# Patient Record
Sex: Female | Born: 1975
Health system: Southern US, Community
[De-identification: ages and names within clinical notes are randomized; demographics above are authoritative.]

## PROBLEM LIST (undated history)

## (undated) DIAGNOSIS — I7300 Raynaud's syndrome without gangrene: Secondary | ICD-10-CM

## (undated) DIAGNOSIS — E559 Vitamin D deficiency, unspecified: Secondary | ICD-10-CM

## (undated) HISTORY — DX: Raynaud's syndrome without gangrene: I73.00

## (undated) HISTORY — PX: KNEE SURGERY: SHX244

## (undated) HISTORY — DX: Vitamin D deficiency, unspecified: E55.9

---

## 1998-03-11 ENCOUNTER — Ambulatory Visit (HOSPITAL_COMMUNITY): Admission: RE | Admit: 1998-03-11 | Discharge: 1998-03-11 | Payer: Self-pay | Admitting: Internal Medicine

## 1998-11-27 ENCOUNTER — Encounter: Admission: RE | Admit: 1998-11-27 | Discharge: 1998-12-30 | Payer: Self-pay

## 1999-01-02 ENCOUNTER — Other Ambulatory Visit: Admission: RE | Admit: 1999-01-02 | Discharge: 1999-01-02 | Payer: Self-pay | Admitting: Gynecology

## 1999-11-06 ENCOUNTER — Emergency Department (HOSPITAL_COMMUNITY): Admission: EM | Admit: 1999-11-06 | Discharge: 1999-11-06 | Payer: Self-pay | Admitting: Emergency Medicine

## 2000-01-11 ENCOUNTER — Encounter: Admission: RE | Admit: 2000-01-11 | Discharge: 2000-01-11 | Payer: Self-pay | Admitting: Gynecology

## 2000-01-11 ENCOUNTER — Encounter: Payer: Self-pay | Admitting: Gynecology

## 2000-04-11 ENCOUNTER — Other Ambulatory Visit: Admission: RE | Admit: 2000-04-11 | Discharge: 2000-04-11 | Payer: Self-pay | Admitting: Gynecology

## 2000-11-16 ENCOUNTER — Encounter: Payer: Self-pay | Admitting: Obstetrics and Gynecology

## 2000-11-16 ENCOUNTER — Encounter: Admission: RE | Admit: 2000-11-16 | Discharge: 2000-11-16 | Payer: Self-pay | Admitting: Obstetrics and Gynecology

## 2000-12-12 ENCOUNTER — Inpatient Hospital Stay (HOSPITAL_COMMUNITY): Admission: AD | Admit: 2000-12-12 | Discharge: 2000-12-12 | Payer: Self-pay | Admitting: *Deleted

## 2000-12-15 ENCOUNTER — Inpatient Hospital Stay (HOSPITAL_COMMUNITY): Admission: AD | Admit: 2000-12-15 | Discharge: 2000-12-15 | Payer: Self-pay | Admitting: Obstetrics & Gynecology

## 2000-12-21 ENCOUNTER — Inpatient Hospital Stay (HOSPITAL_COMMUNITY): Admission: AD | Admit: 2000-12-21 | Discharge: 2000-12-21 | Payer: Self-pay | Admitting: Obstetrics and Gynecology

## 2001-03-05 ENCOUNTER — Inpatient Hospital Stay (HOSPITAL_COMMUNITY): Admission: AD | Admit: 2001-03-05 | Discharge: 2001-03-07 | Payer: Self-pay | Admitting: Obstetrics and Gynecology

## 2001-04-10 ENCOUNTER — Other Ambulatory Visit: Admission: RE | Admit: 2001-04-10 | Discharge: 2001-04-10 | Payer: Self-pay | Admitting: Obstetrics and Gynecology

## 2001-12-03 ENCOUNTER — Other Ambulatory Visit: Admission: RE | Admit: 2001-12-03 | Discharge: 2001-12-03 | Payer: Self-pay | Admitting: Obstetrics and Gynecology

## 2002-05-28 ENCOUNTER — Inpatient Hospital Stay (HOSPITAL_COMMUNITY): Admission: AD | Admit: 2002-05-28 | Discharge: 2002-05-31 | Payer: Self-pay | Admitting: Obstetrics and Gynecology

## 2002-07-04 ENCOUNTER — Other Ambulatory Visit: Admission: RE | Admit: 2002-07-04 | Discharge: 2002-07-04 | Payer: Self-pay | Admitting: Obstetrics and Gynecology

## 2003-01-10 ENCOUNTER — Encounter: Admission: RE | Admit: 2003-01-10 | Discharge: 2003-01-10 | Payer: Self-pay | Admitting: Gynecology

## 2003-01-10 ENCOUNTER — Encounter: Payer: Self-pay | Admitting: Gynecology

## 2003-02-11 ENCOUNTER — Encounter: Admission: RE | Admit: 2003-02-11 | Discharge: 2003-02-11 | Payer: Self-pay | Admitting: Gynecology

## 2003-02-11 ENCOUNTER — Encounter: Payer: Self-pay | Admitting: Gynecology

## 2003-07-30 ENCOUNTER — Other Ambulatory Visit: Admission: RE | Admit: 2003-07-30 | Discharge: 2003-07-30 | Payer: Self-pay | Admitting: Gynecology

## 2003-12-09 ENCOUNTER — Inpatient Hospital Stay (HOSPITAL_COMMUNITY): Admission: AD | Admit: 2003-12-09 | Discharge: 2003-12-09 | Payer: Self-pay | Admitting: Obstetrics & Gynecology

## 2004-01-28 ENCOUNTER — Inpatient Hospital Stay (HOSPITAL_COMMUNITY): Admission: AD | Admit: 2004-01-28 | Discharge: 2004-01-31 | Payer: Self-pay | Admitting: Obstetrics and Gynecology

## 2004-01-28 ENCOUNTER — Inpatient Hospital Stay (HOSPITAL_COMMUNITY): Admission: AD | Admit: 2004-01-28 | Discharge: 2004-01-28 | Payer: Self-pay | Admitting: Obstetrics and Gynecology

## 2004-03-12 ENCOUNTER — Other Ambulatory Visit: Admission: RE | Admit: 2004-03-12 | Discharge: 2004-03-12 | Payer: Self-pay | Admitting: Obstetrics and Gynecology

## 2004-08-19 ENCOUNTER — Encounter (INDEPENDENT_AMBULATORY_CARE_PROVIDER_SITE_OTHER): Payer: Self-pay | Admitting: *Deleted

## 2004-08-19 ENCOUNTER — Ambulatory Visit (HOSPITAL_COMMUNITY): Admission: RE | Admit: 2004-08-19 | Discharge: 2004-08-19 | Payer: Self-pay | Admitting: Physical Therapy

## 2005-04-13 ENCOUNTER — Other Ambulatory Visit: Admission: RE | Admit: 2005-04-13 | Discharge: 2005-04-13 | Payer: Self-pay | Admitting: Gynecology

## 2006-04-26 ENCOUNTER — Other Ambulatory Visit: Admission: RE | Admit: 2006-04-26 | Discharge: 2006-04-26 | Payer: Self-pay | Admitting: Gynecology

## 2009-01-09 ENCOUNTER — Ambulatory Visit (HOSPITAL_COMMUNITY): Admission: RE | Admit: 2009-01-09 | Discharge: 2009-01-09 | Payer: Self-pay | Admitting: Family Medicine

## 2009-11-10 ENCOUNTER — Ambulatory Visit (HOSPITAL_COMMUNITY): Admission: RE | Admit: 2009-11-10 | Discharge: 2009-11-10 | Payer: Self-pay | Admitting: Internal Medicine

## 2010-05-29 IMAGING — CT CT ABD-PELV W/O CM
3 of 4 series · 8 of 46 positions shown, 15 images · non-contrast
Comparison: None.

CLINICAL DATA: Right flank pain.  Microhematuria.

CT ABDOMEN AND PELVIS WITHOUT CONTRAST
TECHNIQUE: Multidetector CT imaging of the abdomen and pelvis was
performed following the standard protocol without intravenous
contrast.

[Series 3: lung 5.0 b60f · axial · 0.66mm/px · z∈[-146,-86]mm · 4 of 21 slices shown, 9 images]
[im 5/21  soft-tissue]
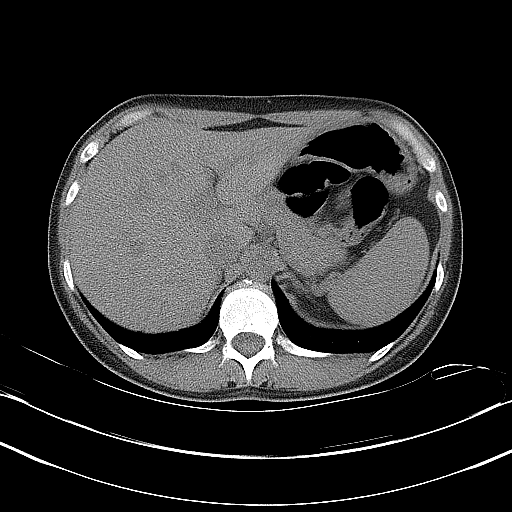
[im 5/21  lung]
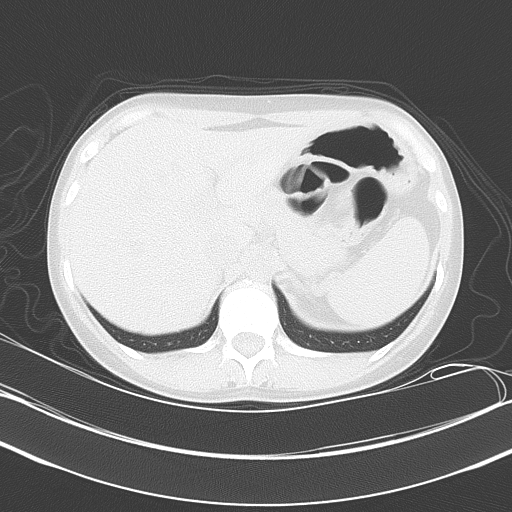
[im 5/21  bone]
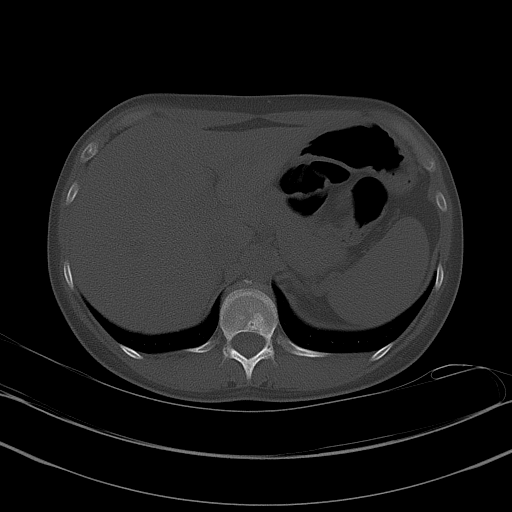
[im 9/21  soft-tissue]
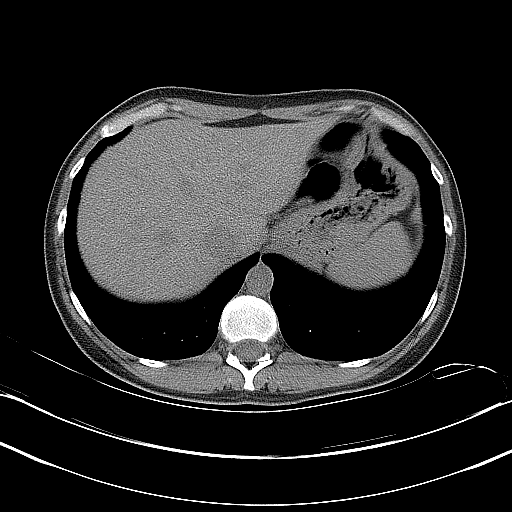
[im 9/21  lung]
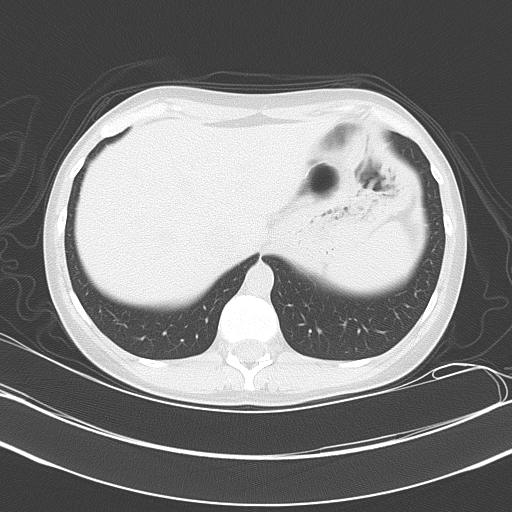
[im 13/21  soft-tissue]
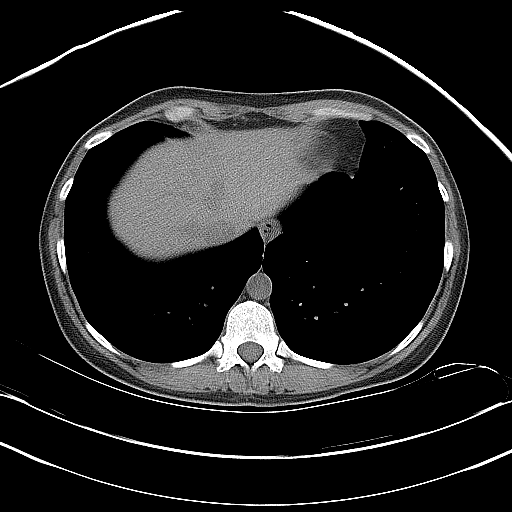
[im 13/21  lung]
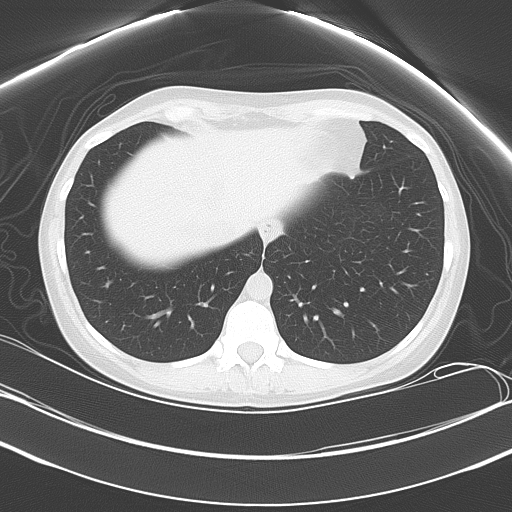
[im 17/21  soft-tissue]
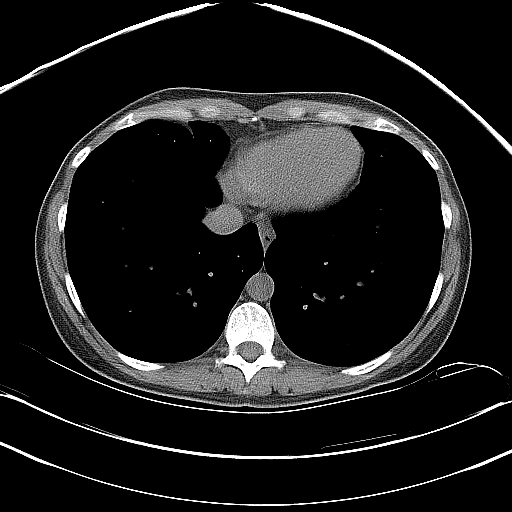
[im 17/21  lung]
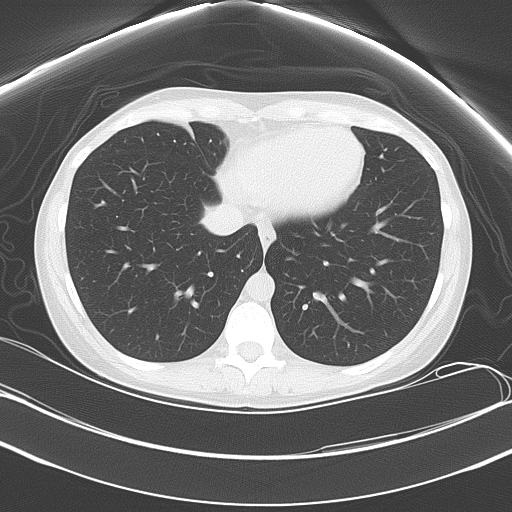

[Series 4: mpr coronal (id) · coronal · 0.64mm/px · 3 of 66 slices shown, 4 images]
[im 22/66  soft-tissue]
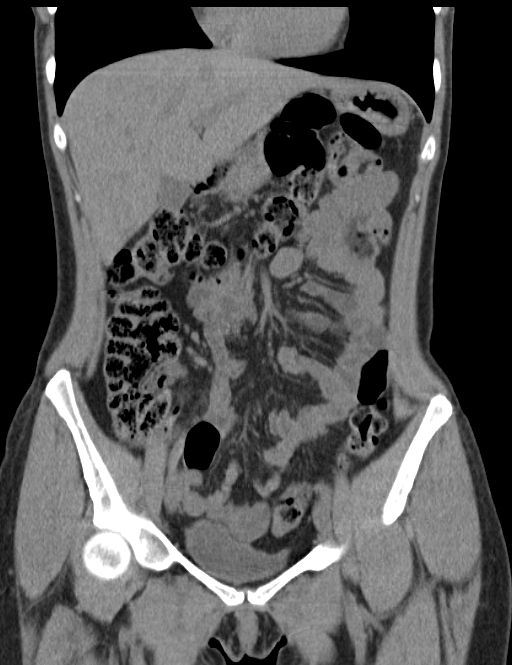
[im 29/66  soft-tissue]
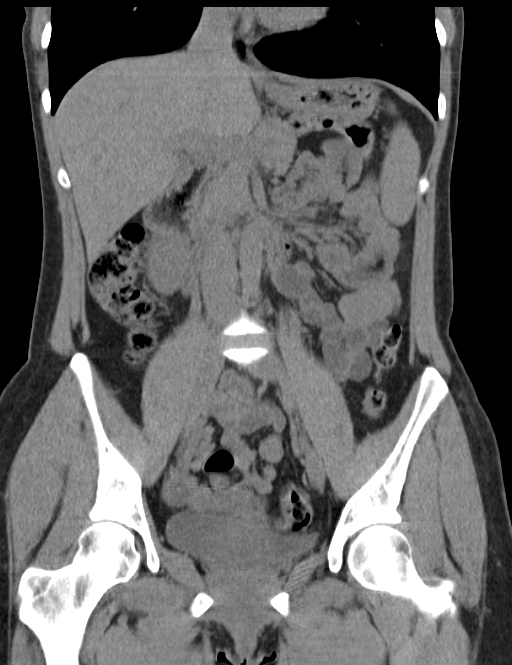
[im 29/66  bone]
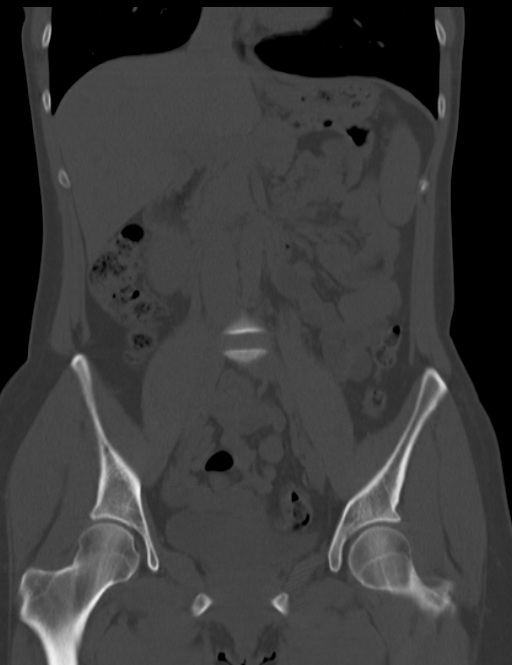
[im 37/66  soft-tissue]
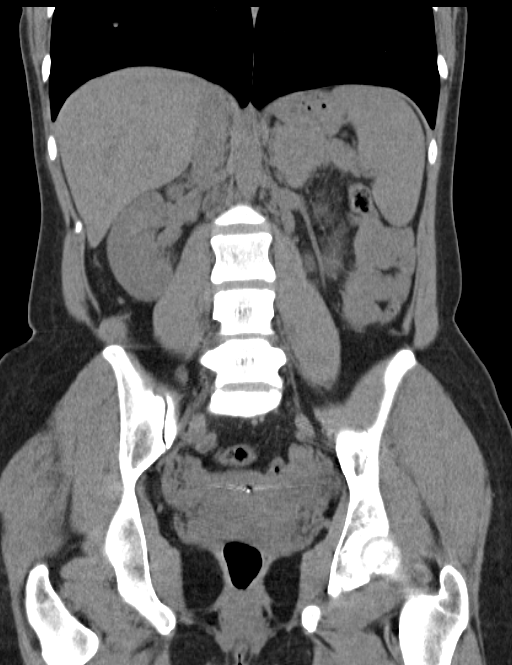

[Series 5: mpr sagittal (id) · sagittal · 0.42mm/px · 1 of 111 slices shown, 2 images]
[im 37/111  soft-tissue]
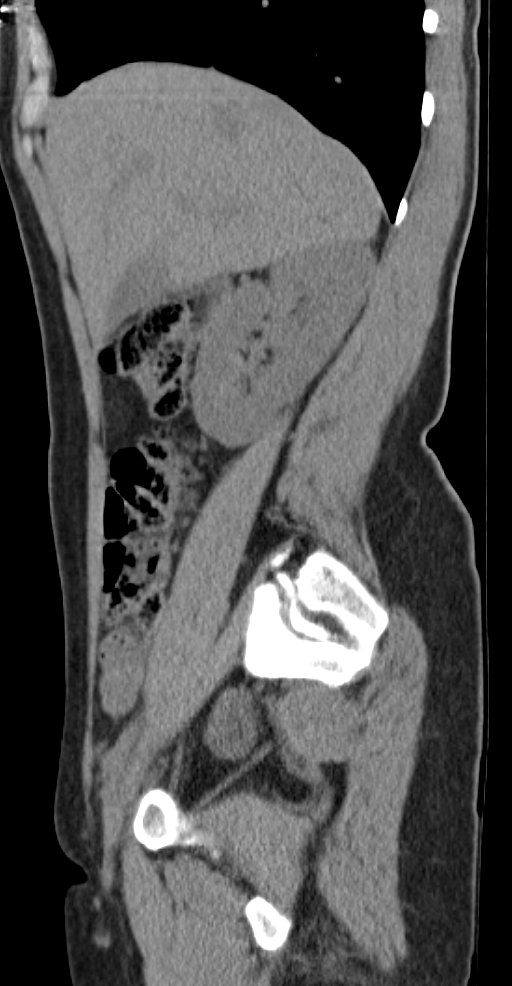
[im 37/111  bone]
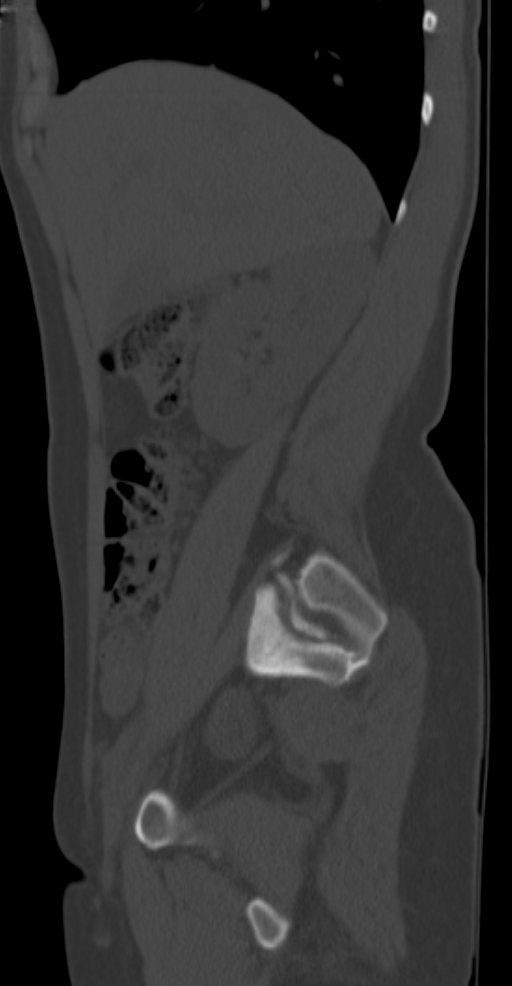

[8 of 46 positions shown; findings below may reference images not displayed]

FINDINGS: The lung bases are clear without focal nodule, mass, or
airspace disease.  Heart size is normal.  No significant pleural or
pericardial effusion is present.  The liver and spleen are within
normal limits.  The stomach, duodenum, and pancreas are normal.
The common bile duct and gallbladder are within normal limits.  The
adrenal glands are unremarkable.  The right kidney is within normal
limits.  The ureter is visualized and normal throughout its course
to the urinary bladder.  A single nonobstructive stone of the left
kidney measures 2.4 mm.

Diverticular changes are noted within the sigmoid colon.  No focal
inflammation is present to suggest diverticulitis.  The remainder
of the colon is within normal limits.  The appendix is visualized
and normal.  The small bowel is unremarkable.  An IUD is in place.
The uterus is otherwise normal.  The adnexa are within normal
limits for age.  No significant free fluid is present.  The urinary
bladder is mostly collapsed.

The bone windows demonstrate degenerative changes of the SI joints
bilaterally.  Inferior endplate Schmorl's nodes are present at T12
and L1.
IMPRESSION: 1.  No significant right-sided nephrolithiasis or hydronephrosis.
2.  2.5 mm nonobstructive stone in the left kidney.
3.  IUD in place without complication.
4.  Normal appendix.

## 2010-12-31 NOTE — Op Note (Signed)
[name], Megan Snow         ACCOUNT NO.:  [id number]   MEDICAL RECORD NO.:  [id number]          PATIENT TYPE:  AMB   LOCATION:  [facility]                           FACILITY:  [facility]   PHYSICIAN:  Dineen Kid. Rana Snare, M.D.    DATE OF BIRTH:  06-Jun-1976   DATE OF PROCEDURE:  08/19/2004  DATE OF DISCHARGE:                                 OPERATIVE REPORT   PREOPERATIVE DIAGNOSIS:  Intrauterine pregnancy at 7 weeks, desired  termination.   POSTOPERATIVE DIAGNOSIS:  Intrauterine pregnancy at 7 weeks, desired  termination.   PROCEDURE:  Dilatation and evacuation.   SURGEON:  Dineen Kid. Rana Snare, M.D.   ANESTHESIA:  Monitored anesthesia care and paracervical block.   INDICATIONS FOR PROCEDURE:  The patient is a 35 year old gravida 4, para 3,  who finds herself unexpectedly pregnant with three children now under the  age of 31.  For social and emotional reasons, she desires termination of  pregnancy and presents for that today.  Risks and benefits were discussed.  Informed consent was obtained.  Her blood type is A positive.   DESCRIPTION OF PROCEDURE:  After adequate analgesia, the patient was placed  in the dorsal lithotomy position.  She was sterilely prepped and draped.  The bladder was sterilely drained.  Graves speculum was placed.  Paracervical block was placed with 1% Xylocaine with 1:100,000 epinephrine,  20 mL total used.  The uterus was sounded to 8 cm and easily dilated to a  #27 Pratt dilator.  A 7 mm suction curet was inserted and products of  conception were retrieved.  This was performed until a gritty surface was  felt throughout the endometrial cavity and no more residual products of  conception.  The patient was given Methergine 0.2 mg IM with good response.  The curet was removed.  The speculum removed from the cervix.  Hemostasis  was achieved.  The speculum was then removed and the patient transferred to  the recovery room in stable condition.  Needle, sponge, and instrument  counts correct x3. Estimated blood loss was 25 mL.  The patient received 30  mg of Toradol IV and 1 gram of Rocephin IV in the recovery room.   DISPOSITION:  The patient will be discharged home and will follow up in the  office in two to three weeks.  She was sent home with a prescription for  Methergine 0.2 mg to take every eight hours for two days and told to take  ibuprofen as needed.  She was told to return for increased pain, fever, or  bleeding.     Davi   DCL/MEDQ  D:  08/19/2004  T:  08/19/2004  Job:  [id number]

## 2011-03-21 ENCOUNTER — Ambulatory Visit (HOSPITAL_COMMUNITY)
Admission: RE | Admit: 2011-03-21 | Discharge: 2011-03-21 | Disposition: A | Discharge status: Home / Self Care | Payer: BC Managed Care – PPO | Source: Ambulatory Visit | Attending: Family Medicine | Admitting: Family Medicine

## 2011-03-21 ENCOUNTER — Other Ambulatory Visit (HOSPITAL_COMMUNITY): Payer: Self-pay | Admitting: Family Medicine

## 2011-03-21 DIAGNOSIS — R059 Cough, unspecified: Secondary | ICD-10-CM

## 2011-03-21 DIAGNOSIS — R05 Cough: Secondary | ICD-10-CM

## 2012-02-29 ENCOUNTER — Ambulatory Visit (INDEPENDENT_AMBULATORY_CARE_PROVIDER_SITE_OTHER): Payer: BC Managed Care – PPO

## 2012-02-29 ENCOUNTER — Encounter: Payer: Self-pay | Admitting: Orthopedic Surgery

## 2012-02-29 ENCOUNTER — Ambulatory Visit (INDEPENDENT_AMBULATORY_CARE_PROVIDER_SITE_OTHER): Payer: BC Managed Care – PPO | Admitting: Orthopedic Surgery

## 2012-02-29 VITALS — BP 130/80 | Ht 65.0 in | Wt 136.0 lb

## 2012-02-29 DIAGNOSIS — M7711 Lateral epicondylitis, right elbow: Secondary | ICD-10-CM

## 2012-02-29 DIAGNOSIS — M7710 Lateral epicondylitis, unspecified elbow: Secondary | ICD-10-CM

## 2012-02-29 DIAGNOSIS — M25521 Pain in right elbow: Secondary | ICD-10-CM

## 2012-02-29 DIAGNOSIS — M25529 Pain in unspecified elbow: Secondary | ICD-10-CM

## 2012-02-29 NOTE — Patient Instructions (Addendum)
You have received a steroid shot. 15% of patients experience increased pain at the injection site with in the next 24 hours. This is best treated with ice and tylenol extra strength 2 tabs every 8 hours. If you are still having pain please call the office.   Apply over the counter max freeze as needed   Knee exercises   Elbow exercises   Lateral Epicondylitis (Tennis Elbow) with Rehab Lateral epicondylitis involves inflammation and pain around the outer portion of the elbow. The pain is caused by inflammation of the tendons in the forearm that bring back (extend) the wrist. Lateral epicondylittis is also called tennis elbow, because it is very common in tennis players. However, it may occur in any individual who extends the wrist repetitively. If lateral epicondylitis is left untreated, it may become a chronic problem. SYMPTOMS    Pain, tenderness, and inflammation on the outer (lateral) side of the elbow.   Pain or weakness with gripping activities.   Pain that increases with wrist twisting motions (playing tennis, using a screwdriver, opening a door or a jar).   Pain with lifting objects, including a coffee cup.  CAUSES   Lateral epicondylitis is caused by inflammation of the tendons that extend the wrist. Causes of injury may include:  Repetitive stress and strain on the muscles and tendons that extend the wrist.   Sudden change in activity level or intensity.   Incorrect grip in racquet sports.   Incorrect grip size of racquet (often too large).   Incorrect hitting position or technique (usually backhand, leading with the elbow).   Using a racket that is too heavy.  RISK INCREASES WITH:  Sports or occupations that require repetitive and/or strenuous forearm and wrist movements (tennis, squash, racquetball, carpentry).   Poor wrist and forearm strength and flexibility.   Failure to warm up properly before activity.   Resuming activity before healing, rehabilitation, and  conditioning are complete.  PREVENTION    Warm up and stretch properly before activity.   Maintain physical fitness:   Strength, flexibility, and endurance.   Cardiovascular fitness.   Wear and use properly fitted equipment.   Learn and use proper technique and have a coach correct improper technique.   Wear a tennis elbow (counterforce) brace.  PROGNOSIS   The course of this condition depends on the degree of the injury. If treated properly, acute cases (symptoms lasting less than 4 weeks) are often resolved in 2 to 6 weeks. Chronic (longer lasting cases) often resolve in 3 to 6 months, but may require physical therapy. RELATED COMPLICATIONS    Frequently recurring symptoms, resulting in a chronic problem. Properly treating the problem the first time decreases frequency of recurrence.   Chronic inflammation, scarring tendon degeneration, and partial tendon tear, requiring surgery.   Delayed healing or resolution of symptoms.  TREATMENT   Treatment first involves the use of ice and medicine, to reduce pain and inflammation. Strengthening and stretching exercises may help reduce discomfort, if performed regularly. These exercises may be performed at home, if the condition is an acute injury. Chronic cases may require a referral to a physical therapist for evaluation and treatment. Your caregiver may advise a corticosteroid injection, to help reduce inflammation. Rarely, surgery is needed. MEDICATION  If pain medicine is needed, nonsteroidal anti-inflammatory medicines (aspirin and ibuprofen), or other minor pain relievers (acetaminophen), are often advised.   Do not take pain medicine for 7 days before surgery.   Prescription pain relievers may be given, if  your caregiver thinks they are needed. Use only as directed and only as much as you need.   Corticosteroid injections may be recommended. These injections should be reserved only for the most severe cases, because they can only  be given a certain number of times.  HEAT AND COLD  Cold treatment (icing) should be applied for 10 to 15 minutes every 2 to 3 hours for inflammation and pain, and immediately after activity that aggravates your symptoms. Use ice packs or an ice massage.   Heat treatment may be used before performing stretching and strengthening activities prescribed by your caregiver, physical therapist, or athletic trainer. Use a heat pack or a warm water soak.  SEEK MEDICAL CARE IF: Symptoms get worse or do not improve in 2 weeks, despite treatment. EXERCISES   RANGE OF MOTION (ROM) AND STRETCHING EXERCISES - Epicondylitis, Lateral (Tennis Elbow) These exercises may help you when beginning to rehabilitate your injury. Your symptoms may go away with or without further involvement from your physician, physical therapist or athletic trainer. While completing these exercises, remember:    Restoring tissue flexibility helps normal motion to return to the joints. This allows healthier, less painful movement and activity.   An effective stretch should be held for at least 30 seconds.   A stretch should never be painful. You should only feel a gentle lengthening or release in the stretched tissue.  RANGE OF MOTION - Wrist Flexion, Active-Assisted  Extend your right / left elbow with your fingers pointing down.*   Gently pull the back of your hand towards you, until you feel a gentle stretch on the top of your forearm.   Hold this position for __________ seconds.  Repeat __________ times. Complete this exercise __________ times per day.   *If directed by your physician, physical therapist or athletic trainer, complete this stretch with your elbow bent, rather than extended. RANGE OF MOTION - Wrist Extension, Active-Assisted  Extend your right / left elbow and turn your palm upwards.*   Gently pull your palm and fingertips back, so your wrist extends and your fingers point more toward the ground.   You  should feel a gentle stretch on the inside of your forearm.   Hold this position for __________ seconds.  Repeat __________ times. Complete this exercise __________ times per day. *If directed by your physician, physical therapist or athletic trainer, complete this stretch with your elbow bent, rather than extended. STRETCH - Wrist Flexion  Place the back of your right / left hand on a tabletop, leaving your elbow slightly bent. Your fingers should point away from your body.   Gently press the back of your hand down onto the table by straightening your elbow. You should feel a stretch on the top of your forearm.   Hold this position for __________ seconds.  Repeat __________ times. Complete this stretch __________ times per day.   STRETCH - Wrist Extension   Place your right / left fingertips on a tabletop, leaving your elbow slightly bent. Your fingers should point backwards.   Gently press your fingers and palm down onto the table by straightening your elbow. You should feel a stretch on the inside of your forearm.   Hold this position for __________ seconds.  Repeat __________ times. Complete this stretch __________ times per day.   STRENGTHENING EXERCISES - Epicondylitis, Lateral (Tennis Elbow) These exercises may help you when beginning to rehabilitate your injury. They may resolve your symptoms with or without further involvement from your  physician, physical therapist or athletic trainer. While completing these exercises, remember:    Muscles can gain both the endurance and the strength needed for everyday activities through controlled exercises.   Complete these exercises as instructed by your physician, physical therapist or athletic trainer. Increase the resistance and repetitions only as guided.   You may experience muscle soreness or fatigue, but the pain or discomfort you are trying to eliminate should never worsen during these exercises. If this pain does get worse, stop and  make sure you are following the directions exactly. If the pain is still present after adjustments, discontinue the exercise until you can discuss the trouble with your caregiver.  STRENGTH - Wrist Flexors  Sit with your right / left forearm palm-up and fully supported on a table or countertop. Your elbow should be resting below the height of your shoulder. Allow your wrist to extend over the edge of the surface.   Loosely holding a __________ weight, or a piece of rubber exercise band or tubing, slowly curl your hand up toward your forearm.   Hold this position for __________ seconds. Slowly lower the wrist back to the starting position in a controlled manner.  Repeat __________ times. Complete this exercise __________ times per day.   STRENGTH - Wrist Extensors  Sit with your right / left forearm palm-down and fully supported on a table or countertop. Your elbow should be resting below the height of your shoulder. Allow your wrist to extend over the edge of the surface.   Loosely holding a __________ weight, or a piece of rubber exercise band or tubing, slowly curl your hand up toward your forearm.   Hold this position for __________ seconds. Slowly lower the wrist back to the starting position in a controlled manner.  Repeat __________ times. Complete this exercise __________ times per day.   STRENGTH - Ulnar Deviators  Stand with a ____________________ weight in your right / left hand, or sit while holding a rubber exercise band or tubing, with your healthy arm supported on a table or countertop.   Move your wrist, so that your pinkie travels toward your forearm and your thumb moves away from your forearm.   Hold this position for __________ seconds and then slowly lower the wrist back to the starting position.  Repeat __________ times. Complete this exercise __________ times per day STRENGTH - Radial Deviators  Stand with a ____________________ weight in your right / left hand, or sit  while holding a rubber exercise band or tubing, with your injured arm supported on a table or countertop.   Raise your hand upward in front of you or pull up on the rubber tubing.   Hold this position for __________ seconds and then slowly lower the wrist back to the starting position.  Repeat __________ times. Complete this exercise __________ times per day. STRENGTH - Forearm Supinators   Sit with your right / left forearm supported on a table, keeping your elbow below shoulder height. Rest your hand over the edge, palm down.   Gently grip a hammer or a soup ladle.   Without moving your elbow, slowly turn your palm and hand upward to a "thumbs-up" position.   Hold this position for __________ seconds. Slowly return to the starting position.  Repeat __________ times. Complete this exercise __________ times per day.   STRENGTH - Forearm Pronators   Sit with your right / left forearm supported on a table, keeping your elbow below shoulder height. Rest your hand  over the edge, palm up.   Gently grip a hammer or a soup ladle.   Without moving your elbow, slowly turn your palm and hand upward to a "thumbs-up" position.   Hold this position for __________ seconds. Slowly return to the starting position.  Repeat __________ times. Complete this exercise __________ times per day.   STRENGTH - Grip  Grasp a tennis ball, a dense sponge, or a large, rolled sock in your hand.   Squeeze as hard as you can, without increasing any pain.   Hold this position for __________ seconds. Release your grip slowly.  Repeat __________ times. Complete this exercise __________ times per day.   STRENGTH - Elbow Extensors, Isometric  Stand or sit upright, on a firm surface. Place your right / left arm so that your palm faces your stomach, and it is at the height of your waist.   Place your opposite hand on the underside of your forearm. Gently push up as your right / left arm resists. Push as hard as you  can with both arms, without causing any pain or movement at your right / left elbow. Hold this stationary position for __________ seconds.  Gradually release the tension in both arms. Allow your muscles to relax completely before repeating. Document Released: 08/01/2005 Document Revised: 07/21/2011 Document Reviewed: 11/13/2008 [facility] Patient Information 2012 [place], [place].Patellofemoral Pain Your exam shows your knee pain is probably due to a problem with the knee cap, the patella. This problem is also called patellofemoral pain, runner's knee, or chondromalacia. Most of the time, this problem is due to overuse of the knee joint. Repeated bending and straightening can irritate the underside of the knee cap. When this happens, activities such as running, walking, climbing, biking or jumping usually produce pain. Pain may also occur after prolonged sitting. Other patellofemoral symptoms can include joint stiffness, swelling, and a snapping or grinding sensation with movement. Rest and rehabilitation are usually successful in treating this problem. Surgery is rarely needed. Treatment includes correcting any mechanical factors that could hurt the normal working of the knee. This could be weak thigh muscles or foot problems. Avoid repetitive activities of the knee until the pain and other symptoms improve. Apply ice packs over the knee for 20 to 30 minutes every 2 to 4 hours to reduce pain and swelling. Only take over-the-counter or prescription medicines for pain, discomfort, or fever as directed by your caregiver. Knee braces or neoprene sleeves may help reduce irritation. Rehabilitation exercises to strengthen the quad muscle are often prescribed when your symptoms are better. Call your caregiver for a follow-up exam to evaluate your response to treatment. Document Released: 09/08/2004 Document Revised: 07/21/2011 Document Reviewed: 08/01/2005 [facility] Patient Information 2012 [place],  [place].Chondromalacia Your exam shows your knee pain is likely due to a cartilage swelling and irritation under the knee cap called chondromalacia. The knee cap moves up and down in its groove when you walk, run, or squat. It can become irritated from sports or work activities if the knee cap is not lined up perfectly or your quadriceps muscle is relatively weak. This can cause pain, usually around the knee cap but sometimes the back of the knee. It is most common in young and active people. Climbing stairs, prolonged sitting and rising from a chair will often make the pain worse. Treatment includes rest from activities which make it worse. The pain can be reduced with ice packs and anti-inflammatory pain medicine. Exercises to strengthen the thigh (quadriceps) muscle may help  prevent further episodes of this condition. Shoe inserts to correct imbalances in the legs or feet may be prescribed by your doctor or a specialist. Support for the knee cap with a light brace may also be helpful. Call your caregiver if you are not improving after 2 - 3 weeks of treatment.   SEEK MEDICAL CARE IF:   You have increasing pain or your knee becomes hot, swollen, red, or begins to give out or lock up on you. Document Released: 09/08/2004 Document Revised: 07/21/2011 Document Reviewed: 01/27/2009 [facility] Patient Information 2012 [place], [place].

## 2012-03-01 ENCOUNTER — Encounter: Payer: Self-pay | Admitting: Orthopedic Surgery

## 2012-03-01 DIAGNOSIS — M7711 Lateral epicondylitis, right elbow: Secondary | ICD-10-CM | POA: Insufficient documentation

## 2012-03-01 NOTE — Progress Notes (Signed)
Patient ID: Megan Snow, female   DOB: 11/26/1975, 36 y.o.   MRN: [id number] Chief complaint right elbow pain  Secondary complaint right and left knee pain  The patient starting having right elbow pain 2-3 months ago came on gradually she does injury running. The pain is described as sharp stabbing and burning along the lateral elbow and associated with 8/10 intensity. She complains of numbness tingling as well.  She denies trauma  She had surgery on her right knee with arthroscopy secondary to patellar dislocation with osteochondral fragment requiring removal.  She has anterior pain in the left knee with a clicking sensation which is worse with stairs  Review of systems is completely normal  She has a relatively normal medical history  History reviewed. No pertinent past medical history.  Past Surgical History  Procedure Date  . Knee surgery     right   The patient is normal development grooming and hygiene with normal orientation x3 her mood and affect are normal her gait is normal. Right Knee Exam   Tenderness  The patient is experiencing tenderness in the patella.  Range of Motion  The patient has normal right knee ROM.  Muscle Strength   The patient has normal right knee strength.  Tests  McMurray:  Medial - negative Lateral - negative Lachman:  Anterior - negative    Posterior - negative Drawer:       Anterior - negative    Posterior - negative Varus: negative Valgus: negative Pivot Shift: negative Patellar Apprehension: positive  Other  Erythema: absent Scars: present Sensation: normal Pulse: present Swelling: none  Comments:  Crepitance in the patellofemoral joint   Left Knee Exam   Tenderness  The patient is experiencing tenderness in the patella.  Range of Motion  The patient has normal left knee ROM.  Muscle Strength   The patient has normal left knee strength.  Tests  McMurray:  Medial - negative Lateral - negative Lachman:   Anterior - negative    Posterior - negative Drawer:       Anterior - negative     Posterior - negative Varus: negative Valgus: negative Pivot Shift: negative Patellar Apprehension: negative  Other  Erythema: absent Scars: absent Sensation: normal Pulse: present Swelling: none  Comments:  Crepitance in the patellofemoral joint   Right Elbow Exam   Tenderness  The patient is experiencing tenderness in the lateral epicondyle.   Range of Motion  The patient has normal right elbow ROM.  Muscle Strength  The patient has normal right elbow strength.  Tests Varus: negative Valgus: negative Tinel's Sign (cubital tunnel): negative  Other  Erythema: absent Scars: absent Sensation: normal Pulse: present  Comments:  Provocative tests for tennis elbow include long finger extension test positive wrist extension test against resistance positive   Left Elbow Exam  Left elbow exam is normal.  Tenderness  The patient is experiencing no tenderness.     Range of Motion  The patient has normal left elbow ROM.  Muscle Strength  The patient has normal left elbow strength.  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 (right )  Consent.  Timeout to confirm  site.  right elbow was injected with Depo-Medrol 40 mg and lidocaine 1% 3 cc with sterile technique using alcohol and ethyl chloride prep.  There were no complications

## 2012-10-11 ENCOUNTER — Ambulatory Visit (INDEPENDENT_AMBULATORY_CARE_PROVIDER_SITE_OTHER): Payer: BC Managed Care – PPO | Admitting: Otolaryngology

## 2012-10-11 DIAGNOSIS — R070 Pain in throat: Secondary | ICD-10-CM

## 2012-10-11 DIAGNOSIS — R05 Cough: Secondary | ICD-10-CM

## 2012-10-11 DIAGNOSIS — R059 Cough, unspecified: Secondary | ICD-10-CM

## 2012-10-16 ENCOUNTER — Other Ambulatory Visit: Payer: Self-pay | Admitting: Gynecology

## 2012-10-18 ENCOUNTER — Ambulatory Visit (INDEPENDENT_AMBULATORY_CARE_PROVIDER_SITE_OTHER): Payer: BC Managed Care – PPO | Admitting: Otolaryngology

## 2013-06-14 ENCOUNTER — Other Ambulatory Visit (HOSPITAL_COMMUNITY): Payer: Self-pay | Admitting: Family Medicine

## 2013-06-14 DIAGNOSIS — Z139 Encounter for screening, unspecified: Secondary | ICD-10-CM

## 2013-06-24 ENCOUNTER — Inpatient Hospital Stay (HOSPITAL_COMMUNITY): Admission: RE | Admit: 2013-06-24 | Payer: BC Managed Care – PPO | Source: Ambulatory Visit

## 2015-04-02 ENCOUNTER — Other Ambulatory Visit: Payer: Self-pay | Admitting: Obstetrics & Gynecology

## 2015-04-13 ENCOUNTER — Other Ambulatory Visit: Payer: Self-pay | Admitting: Obstetrics & Gynecology

## 2015-10-23 ENCOUNTER — Ambulatory Visit (INDEPENDENT_AMBULATORY_CARE_PROVIDER_SITE_OTHER): Payer: BLUE CROSS/BLUE SHIELD | Admitting: Neurology

## 2015-10-23 ENCOUNTER — Encounter: Payer: Self-pay | Admitting: Neurology

## 2015-10-23 VITALS — BP 105/73 | HR 81 | Resp 16 | Ht 65.0 in | Wt 144.0 lb

## 2015-10-23 DIAGNOSIS — R202 Paresthesia of skin: Secondary | ICD-10-CM | POA: Diagnosis not present

## 2015-10-23 DIAGNOSIS — I7300 Raynaud's syndrome without gangrene: Secondary | ICD-10-CM

## 2015-10-23 HISTORY — DX: Raynaud's syndrome without gangrene: I73.00

## 2015-10-23 NOTE — Progress Notes (Signed)
Reason for visit: Dysesthesias  Referring physician: Dr. Ronelle Nigh is a 40 y.o. female  History of present illness:  Megan Snow is a 40 year old right-handed white female with a history of numbness in the feet that has been present for about 2 or 3 months. The patient began having burning and tingling sensations beginning about 6 weeks ago. The sensations are worse in the evening hours, and may keep her awake at night. She did not respond to Requip, but the gabapentin has helped her rest. The patient does not notice the symptoms as much during the daytime. 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 In the past, she could not tolerate Norvasc as a treatment secondary to blood pressure issues. The patient was seen by Dr. Dulcy Fanny in 2013 for this. The patient has been found to have a positive ANA. The patient has symptoms that are a bit worse on the right foot than the left. The patient denies any back pain or significant neck discomfort or pain down the arms or legs. The patient denies issues with balance or difficulty controlling the bowels or the bladder. She is sent to this office for further evaluation.  Past Medical History  Diagnosis Date  . Raynaud disease 10/23/2015  . Vitamin D deficiency     Past Surgical History  Procedure Laterality Date  . Knee surgery      right    Family History  Problem Relation Age of Onset  . Arthritis    . Cancer    . Diabetes    . Hypertension Mother   . COPD Mother   . Hypertension Father   . Neuropathy Father     Social history:  reports that she has never smoked. She does not have any smokeless tobacco history on file. She reports that she does not drink alcohol or use illicit drugs.  Medications:  Prior to Admission medications   Medication Sig Start Date End Date Taking? Authorizing Provider  IBUPROFEN PO Take by mouth.    Historical Provider, MD  zolpidem (AMBIEN) 10 MG  tablet Take 2.5 mg by mouth at bedtime as needed.    Historical Provider, MD     No Known Allergies  ROS:  Out of a complete 14 system review of symptoms, the patient complains only of the following symptoms, and all other reviewed systems are negative.  Fatigue Joint pain  Blood pressure 105/73, pulse 81, resp. rate 16, height  (1.651 m), weight 144 lb (65.318 kg), last menstrual period 10/12/2015.  Physical Exam  General: The patient is alert and cooperative at the time of the examination.  Eyes: Pupils are equal, round, and reactive to light. Discs are flat bilaterally.  Neck: The neck is supple, no carotid bruits are noted.  Respiratory: The respiratory examination is clear.  Cardiovascular: The cardiovascular examination reveals a regular rate and rhythm, no obvious murmurs or rubs are noted.  Skin: Extremities are without significant edema.  Neurologic Exam  Mental status: The patient is alert and oriented x 3 at the time of the examination. The patient has apparent normal recent and remote memory, with an apparently normal attention span and concentration ability.  Cranial nerves: Facial symmetry is present. There is good sensation of the face to pinprick and soft touch bilaterally. The strength of the facial muscles and the muscles to head turning and shoulder shrug are normal bilaterally. Speech is well enunciated, no aphasia or dysarthria is  noted. Extraocular movements are full. Visual fields are full. The tongue is midline, and the patient has symmetric elevation of the soft palate. No obvious hearing deficits are noted.  Motor: The motor testing reveals 5 over 5 strength of all 4 extremities. Good symmetric motor tone is noted throughout. The patient is able walk on heels and the toes bilaterally.  Sensory: Sensory testing is intact to pinprick, soft touch, vibration sensation, and position sense on all 4 extremities. No definite stocking pattern pinprick sensory  deficit was noted. No evidence of extinction is noted.  Coordination: Cerebellar testing reveals good finger-nose-finger and heel-to-shin bilaterally.  Gait and station: Gait is normal. Tandem gait is normal. Romberg is negative. No drift is seen.  Reflexes: Deep tendon reflexes are symmetric and normal bilaterally. Toes are downgoing bilaterally.   Assessment/Plan:  1. Sensory dysesthesias, rule out peripheral neuropathy  2. Raynauld's phenomena  The patient has symptoms that are consistent with early peripheral neuropathy. The patient has normal reflexes throughout at this time. She will be sent for blood work today, and she will have nerve conduction studies of both legs and EMG of one leg. The patient will follow-up for that study. The patient is on gabapentin which seems to be helpful.  Marlan Palau. Keith Willis MD 10/23/2015 3:14 PM  [facility] [address] [place], [place] [id number]  Phone [phone] Fax [phone]

## 2015-10-26 ENCOUNTER — Telehealth: Payer: Self-pay | Admitting: Neurology

## 2015-10-26 LAB — ENA+DNA/DS+SJORGEN'S
ENA RNP Ab: <0.2 AI (ref 0.0–0.9)
ENA SM Ab Ser-aCnc: <0.2 AI (ref 0.0–0.9)
dsDNA Ab: 47 IU/mL — ABNORMAL HIGH (ref 0–9)

## 2015-10-26 LAB — SEDIMENTATION RATE: SED RATE: 3 mm/h (ref 0–32)

## 2015-10-26 LAB — MULTIPLE MYELOMA PANEL, SERUM
ALBUMIN SERPL ELPH-MCNC: 3.8 g/dL (ref 2.9–4.4)
ALPHA 1: 0.2 g/dL (ref 0.0–0.4)
Albumin/Glob SerPl: 1.4 (ref 0.7–1.7)
Alpha2 Glob SerPl Elph-Mcnc: 0.6 g/dL (ref 0.4–1.0)
B-Globulin SerPl Elph-Mcnc: 1.2 g/dL (ref 0.7–1.3)
GLOBULIN, TOTAL: 2.8 g/dL (ref 2.2–3.9)
Gamma Glob SerPl Elph-Mcnc: 0.9 g/dL (ref 0.4–1.8)
IGA/IMMUNOGLOBULIN A, SERUM: 471 mg/dL — AB (ref 87–352)
IGM (IMMUNOGLOBULIN M), SRM: 54 mg/dL (ref 26–217)
IgG (Immunoglobin G), Serum: 913 mg/dL (ref 700–1600)
TOTAL PROTEIN: 6.6 g/dL (ref 6.0–8.5)

## 2015-10-26 LAB — RHEUMATOID FACTOR

## 2015-10-26 LAB — VITAMIN B12: Vitamin B-12: 394 pg/mL (ref 211–946)

## 2015-10-26 LAB — COPPER, SERUM: Copper: 108 ug/dL (ref 72–166)

## 2015-10-26 LAB — ANGIOTENSIN CONVERTING ENZYME: ANGIO CONVERT ENZYME: 30 U/L (ref 14–82)

## 2015-10-26 LAB — B. BURGDORFI ANTIBODIES

## 2015-10-26 LAB — ANA W/REFLEX: Anti Nuclear Antibody(ANA): POSITIVE — AB

## 2015-10-26 NOTE — Telephone Encounter (Signed)
I called patient. Blood work is unremarkable with exception of a positive ANA, double-stranded DNA antibody is elevated, this is a known entity. She was seen by Dr. Dulcy Fannyrusslow previously for this. The inflammatory markers such as sedimentation rate are quite normal. I am not convinced that this blood test is of any clinical significance. I will see the patient back for EMG and nerve conduction study evaluation.

## 2015-10-28 ENCOUNTER — Telehealth: Payer: Self-pay

## 2015-10-28 NOTE — Telephone Encounter (Signed)
[facility] forwarded via fax to [facility] [phone] 3/15/17fbg

## 2015-11-03 ENCOUNTER — Telehealth: Payer: Self-pay | Admitting: Neurology

## 2015-11-03 MED ORDER — GABAPENTIN 300 MG PO CAPS: 600.0000 mg | ORAL_CAPSULE | Freq: Every day | ORAL | Status: DC

## 2015-11-03 NOTE — Telephone Encounter (Signed)
Pt taking 300mg  po qhs.  Has EMG/[facility] scheduled 11-16-15.

## 2015-11-03 NOTE — Telephone Encounter (Signed)
I called the patient. She is having some increased discomfort with the neuropathy, she is okay to increase the gabapentin to 600 mg at night. I called in a prescription.

## 2015-11-03 NOTE — Telephone Encounter (Signed)
Patient called, feels like she does need increase in gabapentin (NEURONTIN) 300 MG capsule.

## 2015-11-16 ENCOUNTER — Telehealth: Payer: Self-pay | Admitting: Neurology

## 2015-11-16 ENCOUNTER — Encounter: Payer: BLUE CROSS/BLUE SHIELD | Admitting: Neurology

## 2015-11-16 NOTE — Telephone Encounter (Signed)
This patient did not show for a EMG and nerve conduction study appointment today. 

## 2015-11-17 ENCOUNTER — Encounter: Payer: Self-pay | Admitting: Neurology

## 2016-12-12 ENCOUNTER — Emergency Department (HOSPITAL_COMMUNITY)
Admission: EM | Admit: 2016-12-12 | Discharge: 2016-12-12 | Disposition: A | Discharge status: Home / Self Care | Payer: BLUE CROSS/BLUE SHIELD | Attending: Urology | Admitting: Urology

## 2016-12-12 ENCOUNTER — Emergency Department (HOSPITAL_COMMUNITY): Payer: BLUE CROSS/BLUE SHIELD

## 2016-12-12 ENCOUNTER — Encounter (HOSPITAL_COMMUNITY): Payer: Self-pay | Admitting: Emergency Medicine

## 2016-12-12 DIAGNOSIS — N23 Unspecified renal colic: Secondary | ICD-10-CM | POA: Diagnosis not present

## 2016-12-12 DIAGNOSIS — R1032 Left lower quadrant pain: Secondary | ICD-10-CM | POA: Diagnosis present

## 2016-12-12 LAB — CBC WITH DIFFERENTIAL/PLATELET
Basophils Absolute: 0 10*3/uL (ref 0.0–0.1)
Basophils Relative: 0 %
Eosinophils Absolute: 0.1 10*3/uL (ref 0.0–0.7)
Eosinophils Relative: 1 %
HCT: 39.3 % (ref 36.0–46.0)
Hemoglobin: 13.2 g/dL (ref 12.0–15.0)
Lymphocytes Relative: 29 %
Lymphs Abs: 1.9 10*3/uL (ref 0.7–4.0)
MCH: 30.2 pg (ref 26.0–34.0)
MCHC: 33.6 g/dL (ref 30.0–36.0)
MCV: 89.9 fL (ref 78.0–100.0)
Monocytes Absolute: 0.5 10*3/uL (ref 0.1–1.0)
Monocytes Relative: 7 %
NEUTROS ABS: 4.1 10*3/uL (ref 1.7–7.7)
NEUTROS PCT: 63 %
PLATELETS: 267 10*3/uL (ref 150–400)
RBC: 4.37 MIL/uL (ref 3.87–5.11)
RDW: 12.4 % (ref 11.5–15.5)
WBC: 6.6 10*3/uL (ref 4.0–10.5)

## 2016-12-12 LAB — I-STAT CHEM 8, ED
BUN: 20 mg/dL (ref 6–20)
Calcium, Ion: 1.12 mmol/L — ABNORMAL LOW (ref 1.15–1.40)
Chloride: 101 mmol/L (ref 101–111)
Creatinine, Ser: 0.8 mg/dL (ref 0.44–1.00)
Glucose, Bld: 142 mg/dL — ABNORMAL HIGH (ref 65–99)
HEMATOCRIT: 39 % (ref 36.0–46.0)
HEMOGLOBIN: 13.3 g/dL (ref 12.0–15.0)
Potassium: 3.3 mmol/L — ABNORMAL LOW (ref 3.5–5.1)
SODIUM: 138 mmol/L (ref 135–145)
TCO2: 27 mmol/L (ref 0–100)

## 2016-12-12 LAB — URINALYSIS, ROUTINE W REFLEX MICROSCOPIC
BILIRUBIN URINE: NEGATIVE
Glucose, UA: NEGATIVE mg/dL
Ketones, ur: 5 mg/dL — AB
Leukocytes, UA: NEGATIVE
Nitrite: NEGATIVE
Protein, ur: NEGATIVE mg/dL
SPECIFIC GRAVITY, URINE: 1.012 (ref 1.005–1.030)
pH: 9 — ABNORMAL HIGH (ref 5.0–8.0)

## 2016-12-12 LAB — I-STAT BETA HCG BLOOD, ED (MC, WL, AP ONLY)

## 2016-12-12 MED ORDER — METOCLOPRAMIDE HCL 5 MG/ML IJ SOLN
10.0000 mg | Freq: Once | INTRAMUSCULAR | Status: AC
  Administered 2016-12-12: 10 mg via INTRAVENOUS
  Filled 2016-12-12: qty 2

## 2016-12-12 MED ORDER — SODIUM CHLORIDE 0.9 % IV BOLUS (SEPSIS)
1000.0000 mL | Freq: Once | INTRAVENOUS | Status: AC
  Administered 2016-12-12: 1000 mL via INTRAVENOUS

## 2016-12-12 MED ORDER — ONDANSETRON HCL 4 MG/2ML IJ SOLN
4.0000 mg | Freq: Once | INTRAMUSCULAR | Status: AC
  Administered 2016-12-12: 4 mg via INTRAVENOUS
  Filled 2016-12-12: qty 2

## 2016-12-12 MED ORDER — MORPHINE SULFATE (PF) 4 MG/ML IV SOLN
6.0000 mg | Freq: Once | INTRAVENOUS | Status: AC
  Administered 2016-12-12: 6 mg via INTRAVENOUS
  Filled 2016-12-12: qty 2

## 2016-12-12 MED ORDER — KETOROLAC TROMETHAMINE 30 MG/ML IJ SOLN
30.0000 mg | Freq: Once | INTRAMUSCULAR | Status: AC
  Administered 2016-12-12: 30 mg via INTRAVENOUS
  Filled 2016-12-12: qty 1

## 2016-12-12 MED ORDER — HYDROMORPHONE HCL 1 MG/ML IJ SOLN
1.0000 mg | Freq: Once | INTRAMUSCULAR | Status: AC
Start: 2016-12-12 — End: 2016-12-12
  Administered 2016-12-12: 1 mg via INTRAVENOUS

## 2016-12-12 MED ORDER — MORPHINE SULFATE (PF) 4 MG/ML IV SOLN
4.0000 mg | Freq: Once | INTRAVENOUS | Status: AC
  Administered 2016-12-12: 4 mg via INTRAVENOUS
  Filled 2016-12-12: qty 1

## 2016-12-12 MED ORDER — SODIUM CHLORIDE 0.9 % IV SOLN: INTRAVENOUS | Status: DC

## 2016-12-12 MED ORDER — HYDROMORPHONE HCL 1 MG/ML IJ SOLN
1.0000 mg | Freq: Once | INTRAMUSCULAR | Status: AC
Start: 2016-12-12 — End: 2016-12-12
  Administered 2016-12-12: 1 mg via INTRAVENOUS
  Filled 2016-12-12: qty 1

## 2016-12-12 MED ORDER — KCL IN DEXTROSE-NACL 40-5-0.9 MEQ/L-%-% IV SOLN
INTRAVENOUS | Status: DC
  Administered 2016-12-12: 17:00:00 via INTRAVENOUS
  Filled 2016-12-12 (×2): qty 1000

## 2016-12-12 MED ORDER — HYDROMORPHONE HCL 1 MG/ML IJ SOLN
1.0000 mg | Freq: Once | INTRAMUSCULAR | Status: AC
  Administered 2016-12-12: 1 mg via INTRAVENOUS
  Filled 2016-12-12: qty 1

## 2016-12-12 NOTE — ED Notes (Signed)
Pt reports she feels much since dose of reglan and morphine. Per urology, pt to be d/c home with prescriptions for pain and nausea.

## 2016-12-12 NOTE — Consult Note (Signed)
Urology Consult  Referring physician: Alden Server Reason for referral: ureteral stone  Chief Complaint: ureteral stone  History of Present Illness: Today acute left and LLQ pain, severe pain not responding to medical therapy; nausea and no vomiting; no fever; given toradol/morphin/reglan/  Serum Cr normal  CT: 4mm distal left uvjunction stone  Since 330 pm patient feels a lot better and wants to go home  No GU surgery/stone history/UTI and kidney infection year ago  No increased frequency and no nocturia  Modifying factors: There are no other modifying factors  Associated signs and symptoms: There are no other associated signs and symptoms Aggravating and relieving factors: There are no other aggravating or relieving factors Severity: Moderate Duration: Persistent  Past Medical History:  Diagnosis Date  . Raynaud disease 10/23/2015  . Vitamin D deficiency    Past Surgical History:  Procedure Laterality Date  . KNEE SURGERY     right    Medications: I have reviewed the patient's current medications. Allergies: No Known Allergies  Family History  Problem Relation Age of Onset  . Hypertension Mother   . COPD Mother   . Hypertension Father   . Neuropathy Father   . Arthritis    . Cancer    . Diabetes     Social History:  reports that she has never smoked. She has never used smokeless tobacco. She reports that she does not drink alcohol or use drugs.  ROS: All systems are reviewed and negative except as noted. Rest negative  Physical Exam:  Vital signs in last 24 hours: Temp:  [97.7 F (36.5 C)] 97.7 F (36.5 C) (04/30 0933) Pulse Rate:  [61-101] 62 (04/30 1645) Resp:  [17-24] 19 (04/30 1405) BP: (106-137)/(68-97) 107/68 (04/30 1645) SpO2:  [85 %-100 %] 100 % (04/30 1645) Weight:  [65.8 kg (145 lb)] 65.8 kg (145 lb) (04/30 0932)  Cardiovascular: Skin warm; not flushed Respiratory: Breaths quiet; no shortness of breath Abdomen: No masses Neurological:  Normal sensation to touch Musculoskeletal: Normal motor function arms and legs Lymphatics: No inguinal adenopathy Skin: No rashes Genitourinary:non toxic; looks comfortable; no CVA or belly tenderness  Laboratory Data:  Results for orders placed or performed during the hospital encounter of 12/12/16 (from the past 72 hour(s))  CBC with Differential/Platelet     Status: None   Collection Time: 12/12/16  9:46 AM  Result Value Ref Range   WBC 6.6 4.0 - 10.5 K/uL   RBC 4.37 3.87 - 5.11 MIL/uL   Hemoglobin 13.2 12.0 - 15.0 g/dL   HCT 44.0 10.2 - 72.5 %   MCV 89.9 78.0 - 100.0 fL   MCH 30.2 26.0 - 34.0 pg   MCHC 33.6 30.0 - 36.0 g/dL   RDW 36.6 44.0 - 34.7 %   Platelets 267 150 - 400 K/uL   Neutrophils Relative % 63 %   Neutro Abs 4.1 1.7 - 7.7 K/uL   Lymphocytes Relative 29 %   Lymphs Abs 1.9 0.7 - 4.0 K/uL   Monocytes Relative 7 %   Monocytes Absolute 0.5 0.1 - 1.0 K/uL   Eosinophils Relative 1 %   Eosinophils Absolute 0.1 0.0 - 0.7 K/uL   Basophils Relative 0 %   Basophils Absolute 0.0 0.0 - 0.1 K/uL  I-Stat Beta hCG blood, ED ([facility], [facility], [facility] only)     Status: None   Collection Time: 12/12/16  9:59 AM  Result Value Ref Range   I-stat hCG, quantitative <5.0 <5 mIU/mL   Comment 3  Comment:   GEST. AGE      CONC.  (mIU/mL)   <=1 WEEK        5 - 50     2 WEEKS       50 - 500     3 WEEKS       100 - 10,000     4 WEEKS     1,000 - 30,000        FEMALE AND NON-PREGNANT FEMALE:     LESS THAN 5 mIU/mL   I-stat chem 8, ed     Status: Abnormal   Collection Time: 12/12/16  9:59 AM  Result Value Ref Range   Sodium 138 135 - 145 mmol/L   Potassium 3.3 (L) 3.5 - 5.1 mmol/L   Chloride 101 101 - 111 mmol/L   BUN 20 6 - 20 mg/dL   Creatinine, Ser 1.61 0.44 - 1.00 mg/dL   Glucose, Bld 096 (H) 65 - 99 mg/dL   Calcium, Ion 0.45 (L) 1.15 - 1.40 mmol/L   TCO2 27 0 - 100 mmol/L   Hemoglobin 13.3 12.0 - 15.0 g/dL   HCT 40.9 81.1 - 91.4 %  Urinalysis, Routine w reflex microscopic      Status: Abnormal   Collection Time: 12/12/16 10:08 AM  Result Value Ref Range   Color, Urine YELLOW YELLOW   APPearance CLOUDY (A) CLEAR   Specific Gravity, Urine 1.012 1.005 - 1.030   pH 9.0 (H) 5.0 - 8.0   Glucose, UA NEGATIVE NEGATIVE mg/dL   Hgb urine dipstick SMALL (A) NEGATIVE   Bilirubin Urine NEGATIVE NEGATIVE   Ketones, ur 5 (A) NEGATIVE mg/dL   Protein, ur NEGATIVE NEGATIVE mg/dL   Nitrite NEGATIVE NEGATIVE   Leukocytes, UA NEGATIVE NEGATIVE   RBC / HPF 6-30 0 - 5 RBC/hpf   WBC, UA 0-5 0 - 5 WBC/hpf   Bacteria, UA RARE (A) NONE SEEN   Squamous Epithelial / LPF 0-5 (A) NONE SEEN   Mucous PRESENT    Amorphous Crystal PRESENT    No results found for this or any previous visit (from the past 240 hour(s)). Creatinine:  Recent Labs  12/12/16 0959  CREATININE 0.80    Xrays: See report/chart 4 mm stone left uv junction with mild hydro  Impression/Assessment:  Left uv junction stone; picture drawn; medical vs observation and possible u-scope and/or stent tomorrow; pros and cons and sequelae discussed  Plan:  Wants to go home Indications to go to [facility] ER given flomax and hydrocodone and zofran and strainer given Call patient in am  Megan Snow A 12/12/2016, 5:10 PM

## 2016-12-12 NOTE — ED Notes (Signed)
Much calmer now states the pain has eased up

## 2016-12-12 NOTE — ED Provider Notes (Addendum)
[facility]-EMERGENCY DEPT Provider Note   CSN: [id number] Arrival date & time: 12/12/16  7846     History   Chief Complaint Chief Complaint  Patient presents with  . Flank Pain  . Abdominal Pain    HPI Megan Snow is a 41 y.o. female.Complains of left lower quadrant pain radiating to left flank onset 8:15 AM today. Pain is severe not made better or worse by anything. Associated symptoms include nausea no vomiting. Last normal menstrual period 11/20/2016. No treatment prior to coming here  HPI  Past Medical History:  Diagnosis Date  . Raynaud disease 10/23/2015  . Vitamin D deficiency     Patient Active Problem List   Diagnosis Date Noted  . Paresthesia of both lower extremities 10/23/2015  . Raynaud disease 10/23/2015  . Right tennis elbow 03/01/2012    Past Surgical History:  Procedure Laterality Date  . KNEE SURGERY     right    OB History    No data available       Home Medications    Prior to Admission medications   Medication Sig Start Date End Date Taking? Authorizing Provider  ADDERALL XR 20 MG 24 hr capsule Take by mouth daily. 10/16/15   Historical Provider, MD  BELSOMRA 10 MG TABS  09/14/15   Historical Provider, MD  gabapentin (NEURONTIN) 300 MG capsule Take 2 capsules (600 mg total) by mouth at bedtime. 11/03/15   York Spaniel, MD  IBUPROFEN PO Take by mouth.    Historical Provider, MD  Vitamin D, Ergocalciferol, (DRISDOL) 50000 units CAPS capsule Take 50,000 Units by mouth once a week. 10/12/15   Historical Provider, MD    Family History Family History  Problem Relation Age of Onset  . Hypertension Mother   . COPD Mother   . Hypertension Father   . Neuropathy Father   . Arthritis    . Cancer    . Diabetes      Social History Social History  Substance Use Topics  . Smoking status: Never Smoker  . Smokeless tobacco: Never Used  . Alcohol use No     Allergies   Patient has no known allergies.   Review of Systems Review of  Systems  Constitutional: Negative.   HENT: Negative.   Respiratory: Negative.   Cardiovascular: Negative.   Gastrointestinal: Positive for abdominal pain and nausea.  Genitourinary: Positive for flank pain.  Skin: Negative.   Neurological: Negative.   Psychiatric/Behavioral: Negative.   All other systems reviewed and are negative.    Physical Exam Updated Vital Signs BP 124/84 (BP Location: Right Arm)   Pulse 92   Temp 97.7 F (36.5 C) (Oral)   Resp (!) 22   Ht  (1.651 m)   Wt 145 lb (65.8 kg)   LMP 11/20/2016 (Approximate)   SpO2 99%   BMI 24.13 kg/m   Physical Exam  Constitutional: She appears well-developed and well-nourished. She appears distressed.  Appears uncomfortable writhing on the bed  HENT:  Head: Normocephalic and atraumatic.  Eyes: Conjunctivae are normal. Pupils are equal, round, and reactive to light.  Neck: Neck supple. No tracheal deviation present. No thyromegaly present.  Cardiovascular: Normal rate and regular rhythm.   No murmur heard. Pulmonary/Chest: Effort normal and breath sounds normal.  Abdominal: Soft. Bowel sounds are normal. She exhibits no distension. There is no tenderness.  Genitourinary:  Genitourinary Comments: No flank tenderness  Musculoskeletal: Normal range of motion. She exhibits no edema or tenderness.  Neurological: She  is alert. Coordination normal.  Skin: Skin is warm and dry. No rash noted.  Psychiatric: She has a normal mood and affect.  Nursing note and vitals reviewed.    ED Treatments / Results  Labs (all labs ordered are listed, but only abnormal results are displayed) Labs Reviewed  CBC WITH DIFFERENTIAL/PLATELET  URINALYSIS, ROUTINE W REFLEX MICROSCOPIC  I-STAT BETA HCG BLOOD, ED ([facility], [facility], [facility] ONLY)  I-STAT CHEM 8, ED    EKG  EKG Interpretation None       Radiology No results found.  Procedures Procedures (including critical care time)  Medications Ordered in ED Medications  HYDROmorphone  (DILAUDID) injection 1 mg (not administered)  ondansetron (ZOFRAN) injection 4 mg (not administered)     Initial Impression / Assessment and Plan / ED Course  I have reviewed the triage vital signs and the nursing notes.  Pertinent labs & imaging results that were available during my care of the patient were reviewed by me and considered in my medical decision making (see chart for details).     11:25 AM pain and nausea improved after treatment with intravenous opioids and antiemetics 3:30 PM patient continues to vomit and feel nauseated after multiple doses of IV opioids, antiemetics, and IV Toradol. Additional IV morphine and IV Reglan ordered. I consulted Dr.MacDiarmid from urology service who will come to ED to evaluate patient. IV potassium supplementation or Results for orders placed or performed during the hospital encounter of 12/12/16  CBC with Differential/Platelet  Result Value Ref Range   WBC 6.6 4.0 - 10.5 K/uL   RBC 4.37 3.87 - 5.11 MIL/uL   Hemoglobin 13.2 12.0 - 15.0 g/dL   HCT 16.1 09.6 - 04.5 %   MCV 89.9 78.0 - 100.0 fL   MCH 30.2 26.0 - 34.0 pg   MCHC 33.6 30.0 - 36.0 g/dL   RDW 40.9 81.1 - 91.4 %   Platelets 267 150 - 400 K/uL   Neutrophils Relative % 63 %   Neutro Abs 4.1 1.7 - 7.7 K/uL   Lymphocytes Relative 29 %   Lymphs Abs 1.9 0.7 - 4.0 K/uL   Monocytes Relative 7 %   Monocytes Absolute 0.5 0.1 - 1.0 K/uL   Eosinophils Relative 1 %   Eosinophils Absolute 0.1 0.0 - 0.7 K/uL   Basophils Relative 0 %   Basophils Absolute 0.0 0.0 - 0.1 K/uL  Urinalysis, Routine w reflex microscopic  Result Value Ref Range   Color, Urine YELLOW YELLOW   APPearance CLOUDY (A) CLEAR   Specific Gravity, Urine 1.012 1.005 - 1.030   pH 9.0 (H) 5.0 - 8.0   Glucose, UA NEGATIVE NEGATIVE mg/dL   Hgb urine dipstick SMALL (A) NEGATIVE   Bilirubin Urine NEGATIVE NEGATIVE   Ketones, ur 5 (A) NEGATIVE mg/dL   Protein, ur NEGATIVE NEGATIVE mg/dL   Nitrite NEGATIVE NEGATIVE    Leukocytes, UA NEGATIVE NEGATIVE   RBC / HPF 6-30 0 - 5 RBC/hpf   WBC, UA 0-5 0 - 5 WBC/hpf   Bacteria, UA RARE (A) NONE SEEN   Squamous Epithelial / LPF 0-5 (A) NONE SEEN   Mucous PRESENT    Amorphous Crystal PRESENT   I-Stat Beta hCG blood, ED ([facility], [facility], [facility] only)  Result Value Ref Range   I-stat hCG, quantitative <5.0 <5 mIU/mL   Comment 3          I-stat chem 8, ed  Result Value Ref Range   Sodium 138 135 - 145 mmol/L  Potassium 3.3 (L) 3.5 - 5.1 mmol/L   Chloride 101 101 - 111 mmol/L   BUN 20 6 - 20 mg/dL   Creatinine, Ser 8.65 0.44 - 1.00 mg/dL   Glucose, Bld 784 (H) 65 - 99 mg/dL   Calcium, Ion 6.96 (L) 1.15 - 1.40 mmol/L   TCO2 27 0 - 100 mmol/L   Hemoglobin 13.3 12.0 - 15.0 g/dL   HCT 29.5 28.4 - 13.2 %   Ct Renal Stone Study  Result Date: 12/12/2016 CLINICAL DATA:  Left flank pain. Low pelvic pain. Nausea and vomiting. EXAM: CT ABDOMEN AND PELVIS WITHOUT CONTRAST TECHNIQUE: Multidetector CT imaging of the abdomen and pelvis was performed following the standard protocol without IV contrast. COMPARISON:  11/10/2009 FINDINGS: Lower chest: Unremarkable Hepatobiliary: Unremarkable Pancreas: Unremarkable Spleen: Unremarkable Adrenals/Urinary Tract: Adrenal glands normal. Asymmetric left perirenal stranding with mild left hydronephrosis and moderate left hydroureter extending down to a 4 mm left UV J calculus shown on image 77/3. No other calculi are currently identified. Stomach/Bowel: Unremarkable Vascular/Lymphatic: Unremarkable Reproductive: Retroverted uterus. IUD in place, satisfactorily position. Other: No supplemental non-categorized findings. Musculoskeletal: Unremarkable IMPRESSION: 1. Left perirenal stranding and mild left hydronephrosis with moderate left hydroureter associated with a 4 mm left UVJ calculus. No other calculi identified. Electronically Signed   By: Gaylyn Rong M.D.   On: 12/12/2016 12:04   Final Clinical Impressions(s) / ED Diagnoses  Diagnosis #1  ureteral colic #2 intractable pain #3 nausea and vomiting Final diagnoses:  None   #4 hypokalemia New Prescriptions New Prescriptions   No medications on file     Doug Sou, MD 12/12/16 1536    Doug Sou, MD 12/12/16 1537

## 2016-12-12 NOTE — ED Notes (Signed)
States her pain is still 7/10. It comes and goes.

## 2016-12-12 NOTE — ED Notes (Signed)
Returned from xray nauseated.

## 2016-12-12 NOTE — Discharge Instructions (Signed)
Follow-up with [facility] as instructed.

## 2016-12-12 NOTE — ED Notes (Signed)
EDP at bedside  

## 2016-12-12 NOTE — ED Notes (Signed)
Patient states she is feeling better the pain is coming in waves.

## 2016-12-12 NOTE — ED Notes (Signed)
Provider at bedside

## 2016-12-12 NOTE — ED Provider Notes (Signed)
17:52 the patient is alert and pain-free. Urology consultation has been completed. Prescriptions provided by urology with follow-up planned as an outpatient. Patient has been instructed by Dr. Sherron Monday to return to [facility] if she has recurrence of intractable pain.   Arby Barrette, MD 12/12/16 [phone]

## 2016-12-12 NOTE — ED Triage Notes (Signed)
Pt to ER for evaluation of sudden onset left flank pain radiating into left lower abdomen this morning at 8:15. Reports pressure with urination. No hx of kidney stones. Pain 10/10. VSS

## 2018-09-11 ENCOUNTER — Ambulatory Visit (INDEPENDENT_AMBULATORY_CARE_PROVIDER_SITE_OTHER): Payer: BLUE CROSS/BLUE SHIELD

## 2018-09-11 ENCOUNTER — Ambulatory Visit: Payer: BLUE CROSS/BLUE SHIELD | Admitting: Orthopedic Surgery

## 2018-09-11 ENCOUNTER — Encounter: Payer: Self-pay | Admitting: Orthopedic Surgery

## 2018-09-11 VITALS — BP 120/71 | HR 93 | Ht 65.0 in | Wt 150.0 lb

## 2018-09-11 DIAGNOSIS — S43431A Superior glenoid labrum lesion of right shoulder, initial encounter: Secondary | ICD-10-CM

## 2018-09-11 DIAGNOSIS — M25511 Pain in right shoulder: Secondary | ICD-10-CM

## 2018-09-11 DIAGNOSIS — G8929 Other chronic pain: Secondary | ICD-10-CM

## 2018-09-11 MED ORDER — NABUMETONE 500 MG PO TABS: 500.0000 mg | ORAL_TABLET | Freq: Two times a day (BID) | ORAL | 0 refills | Status: DC

## 2018-09-11 NOTE — Progress Notes (Signed)
NEW PATIENT OFFICE VISIT  Chief Complaint  Patient presents with  . Shoulder Pain    right x 2 months no injury     43 year old female presents with a 42-month history of atraumatic onset of right shoulder pain.  She notices that 2 months ago she had intermittent pain that was positional and was in the anterior and posterior joint line of the right shoulder.  She describes a sharp pain on certain movements and rates her pain 5-6 now but on occasion and at night it will get worse where she cannot sleep.  She does have some difficulty reaching away from her body.  She tried ibuprofen icy hot and pain patches over-the-counter and has not gotten any relief   Review of Systems  Musculoskeletal: Positive for joint pain. Negative for neck pain.       Heavy and tired feeling right shoulder   Neurological: Negative for tingling, sensory change, focal weakness and weakness.     Past Medical History:  Diagnosis Date  . Raynaud disease 10/23/2015  . Vitamin D deficiency     Past Surgical History:  Procedure Laterality Date  . KNEE SURGERY     right    Family History  Problem Relation Age of Onset  . Hypertension Mother   . COPD Mother   . Hypertension Father   . Neuropathy Father   . Arthritis Other   . Cancer Other   . Diabetes Other    Social History   Tobacco Use  . Smoking status: Never Smoker  . Smokeless tobacco: Never Used  Substance Use Topics  . Alcohol use: No  . Drug use: No    No Known Allergies  Current Meds  Medication Sig  . gabapentin (NEURONTIN) 300 MG capsule Take 2 capsules (600 mg total) by mouth at bedtime.  Marland Kitchen VYVANSE 30 MG capsule TK 1 C PO D    BP 120/71   Pulse 93   Ht 5\' 5"  (1.651 m)   Wt 150 lb (68 kg)   LMP 09/03/2018 (Approximate)   BMI 24.96 kg/m   Physical Exam Constitutional:      Appearance: Normal appearance. She is well-developed, well-groomed and normal weight.  Neurological:     Mental Status: She is alert and oriented to  person, place, and time.  Psychiatric:        Mood and Affect: Mood normal.        Speech: Speech normal.        Behavior: Behavior normal.        Thought Content: Thought content normal.        Cognition and Memory: Memory normal.        Judgment: Judgment normal.     Right Shoulder Exam   Tests  Apprehension: negative  Other  Erythema: absent Scars: absent Sensation: normal Pulse: present  Comments:  Right shoulder is nontender to palpation.  However, she has full range of motion negative apprehension sign positive speeds test irritation to palpation of the biceps tendon in the bicipital groove no strength deficits in the rotator cuff normal strength with positive  empty can sign   Left Shoulder Exam  Left shoulder exam is normal.  Tenderness  The patient is experiencing no tenderness.   Range of Motion  The patient has normal left shoulder ROM.  Muscle Strength  The patient has normal left shoulder strength.  Tests  Apprehension: negative  Other  Erythema: absent Sensation: normal Pulse: present  MEDICAL DECISION SECTION  Xrays were done at [facility]  My independent reading of xrays:  See my report : xrays were normal   Encounter Diagnoses  Name Primary?  . Chronic right shoulder pain   . Superior glenoid labrum lesion of right shoulder, initial encounter Yes    PLAN: (Rx., injectx, surgery, frx, mri/ct) [facility]  Procedure note the subacromial injection shoulder RIGHT  Verbal consent was obtained to inject the  RIGHT   Shoulder  Timeout was completed to confirm the injection site is a subacromial space of the  RIGHT  shoulder   Medication used Depo-Medrol 40 mg and lidocaine 1% 3 cc  Anesthesia was provided by ethyl chloride  The injection was performed in the RIGHT  posterior subacromial space. After pinning the skin with alcohol and anesthetized the skin with ethyl chloride the subacromial space was injected using a 20-gauge needle.  There were no complications  Sterile dressing was applied.   PT x 4 weeks  nsaid   Fu 4 weeks mri if no improvement   Meds ordered this encounter  Medications  . nabumetone (RELAFEN) 500 MG tablet    Sig: Take 1 tablet (500 mg total) by mouth 2 (two) times daily.    Dispense:  60 tablet    Refill:  0    Fuller Canada, MD  09/11/2018 2:32 PM

## 2018-09-11 NOTE — Patient Instructions (Signed)
SLAP Lesions    Superior labrum anterior posterior (SLAP) lesions are injuries to part of the connective tissue (cartilage) of the shoulder joint. The top of the upper arm bone (humerus) fits into a socket in the shoulder blade to form the shoulder joint. There is a firm rim of cartilage (labrum) around the edge of the socket. The labrum helps to deepen the socket and hold the humerus in place. If a certain part of the labrum becomes frayed or torn, it is called a SLAP lesion. A SLAP lesion can cause shoulder pain, instability, and weakness.  SLAP lesions are common among athletes who play sports that involve repeated overhead movements. SLAP lesions may include a tear in the cord of tissue that attaches the muscle in the front of the upper arm to the shoulder blade (proximal biceps tendon).  What are the causes?  This condition may be caused by:  · A sudden (acute) injury, which can result from:  ? Falling on an outstretched arm.  ? Movement of the shoulder joint out of its normal place (dislocation).  ? A direct hit to the shoulder.  · Wear and tear over time, which can result from doing activities or sports that involve overhead arm movements.  What increases the risk?  The following factors may make you more likely to develop a SLAP lesion:  · Having had a dislocated shoulder in the past.  · Being age 40 or older.  · Playing certain sports, such as:  ? Sports that involve repeated overhead movements, such as baseball or volleyball.  ? Sports that put backward pressure on the arms when the arms are overhead, such as gymnastics or basketball.  ? Contact sports.  · Lifting weights.  What are the signs or symptoms?  The main symptom of this condition is shoulder pain that gets worse when lifting a heavy object or raising the arm overhead. Other signs and symptoms may include:  · Feeling like your shoulder is locking, catching, grinding, or popping.  · Loss of strength.  · Stiffness and limited range of  motion.  · Loss of throwing power (“dead arm”).  How is this diagnosed?  This condition may be diagnosed based on:  · Your symptoms.  · Your medical history.  · A physical exam.  · Imaging tests, such as MRIs.  How is this treated?  Treatment for this condition may include:  · Resting your shoulder by avoiding activities that cause shoulder pain.  · NSAIDs to help reduce pain and swelling.  · Physical therapy to improve strength and range of motion.  · Surgery. This may be done if other treatment methods do not help. Surgery may involve:  ? Removing frayed pieces of the labrum.  ? Repairing tears.  ? Reattaching the labrum.  ? Repairing the biceps tendon.  Follow these instructions at home:  Managing pain, stiffness, and swelling    · If directed, put ice on the injured area.  ? Put ice in a plastic bag.  ? Place a towel between your skin and the bag.  ? Leave the ice on for 20 minutes, 2-3 times a day.  Driving  · Do not drive or operate heavy machinery while taking prescription pain medicine.  · Ask your health care provider when it is safe for you to drive.  Activity  · Return to your normal activities as told by your health care provider. Ask your health care provider what activities are safe for you.  ·   Do exercises as told by your health care provider.  General instructions    · Do not use any tobacco products, such as cigarettes, chewing tobacco, or e-cigarettes. Tobacco can delay bone healing. If you need help quitting, ask your health care provider.  · Take over-the-counter and prescription medicines only as told by your health care provider.  · Keep all follow-up visits as told by your health care provider. This is important.  How is this prevented?  · Be safe and responsible while being active to avoid falls.  · Maintain physical fitness, including strength and flexibility.  Contact a health care provider if:  · Your symptoms have not improved after 6 months of treatment.  · Your symptoms get worse  instead of getting better.  This information is not intended to replace advice given to you by your health care provider. Make sure you discuss any questions you have with your health care provider.  Document Released: 08/01/2005 Document Revised: 04/02/2018 Document Reviewed: 07/04/2015  Elsevier Interactive Patient Education © 2019 Elsevier Inc.

## 2018-09-12 NOTE — Addendum Note (Signed)
Addended byCaffie Damme on: 09/12/2018 09:30 AM   Modules accepted: Orders

## 2018-10-10 ENCOUNTER — Ambulatory Visit: Payer: BLUE CROSS/BLUE SHIELD | Admitting: Orthopedic Surgery

## 2018-10-17 ENCOUNTER — Ambulatory Visit: Payer: BLUE CROSS/BLUE SHIELD | Admitting: Orthopedic Surgery

## 2018-11-07 ENCOUNTER — Ambulatory Visit: Payer: BLUE CROSS/BLUE SHIELD | Admitting: Orthopedic Surgery

## 2018-11-07 ENCOUNTER — Encounter: Payer: Self-pay | Admitting: Orthopedic Surgery

## 2019-02-06 ENCOUNTER — Other Ambulatory Visit: Payer: Self-pay | Admitting: Internal Medicine

## 2019-02-06 ENCOUNTER — Other Ambulatory Visit: Payer: Self-pay

## 2019-02-06 DIAGNOSIS — Z20822 Contact with and (suspected) exposure to covid-19: Secondary | ICD-10-CM

## 2019-02-08 LAB — NOVEL CORONAVIRUS, NAA: SARS-CoV-2, NAA: NOT DETECTED

## 2022-06-14 ENCOUNTER — Telehealth: Payer: BC Managed Care – PPO | Admitting: Physician Assistant

## 2022-06-14 DIAGNOSIS — F900 Attention-deficit hyperactivity disorder, predominantly inattentive type: Secondary | ICD-10-CM | POA: Insufficient documentation

## 2022-06-14 DIAGNOSIS — U071 COVID-19: Secondary | ICD-10-CM | POA: Diagnosis not present

## 2022-06-14 DIAGNOSIS — Z8669 Personal history of other diseases of the nervous system and sense organs: Secondary | ICD-10-CM | POA: Insufficient documentation

## 2022-06-14 MED ORDER — MOLNUPIRAVIR EUA 200MG CAPSULE: 4.0000 | ORAL_CAPSULE | Freq: Two times a day (BID) | ORAL | 0 refills | Status: AC

## 2022-06-14 MED ORDER — FLUTICASONE PROPIONATE 50 MCG/ACT NA SUSP: 2.0000 | Freq: Every day | NASAL | 0 refills | Status: AC

## 2022-06-14 NOTE — Patient Instructions (Addendum)
Baltazar Apo, thank you for joining Leeanne Rio, PA-C for today's virtual visit.  While this provider is not your primary care provider (PCP), if your PCP is located in our provider database this encounter information will be shared with them immediately following your visit.   [facility] account gives you access to today's visit and all your visits, tests, and labs performed at [facility] " click here if you don't have a [place] account or go to [email or web address]  Consent: (Patient) Megan Snow provided verbal consent for this virtual visit at the beginning of the encounter.  Current Medications:  Current Outpatient Medications:    fluticasone (FLONASE) 50 MCG/ACT nasal spray, Place 2 sprays into both nostrils daily., Disp: 16 g, Rfl: 0   molnupiravir EUA (LAGEVRIO) 200 mg CAPS capsule, Take 4 capsules (800 mg total) by mouth 2 (two) times daily for 5 days., Disp: 40 capsule, Rfl: 0   PARAGARD INTRAUTERINE COPPER IU, by Intrauterine route., Disp: , Rfl:    gabapentin (NEURONTIN) 800 MG tablet, Take 800 mg by mouth 3 (three) times daily., Disp: , Rfl:    ibuprofen (ADVIL,MOTRIN) 200 MG tablet, Take 200-800 mg by mouth every 6 (six) hours as needed for mild pain., Disp: , Rfl:    Medications ordered in this encounter:  Meds ordered this encounter  Medications   molnupiravir EUA (LAGEVRIO) 200 mg CAPS capsule    Sig: Take 4 capsules (800 mg total) by mouth 2 (two) times daily for 5 days.    Dispense:  40 capsule    Refill:  0    Order Specific Question:   Supervising Provider    Answer:   Chase Picket [[id number]]   fluticasone (FLONASE) 50 MCG/ACT nasal spray    Sig: Place 2 sprays into both nostrils daily.    Dispense:  16 g    Refill:  0    Order Specific Question:   Supervising Provider    Answer:   Chase Picket [id number]     *If you need refills on other medications prior to your next appointment,  please contact your pharmacy*  Follow-Up: Call back or seek an in-person evaluation if the symptoms worsen or if the condition fails to improve as anticipated.  [place] [phone]  Other Instructions Please keep well-hydrated and get plenty of rest. Start a saline nasal rinse to flush out your nasal passages. You can use plain Mucinex to help thin congestion. If you have a humidifier, running in the bedroom at night. I want you to start OTC vitamin D3 1000 units daily, vitamin C 1000 mg daily, and a zinc supplement. Please take prescribed medications as directed.  You have been enrolled in a MyChart symptom monitoring program. Please answer these questions daily so we can keep track of how you are doing.  You were to quarantine for 5 days from onset of your symptoms.  After day 5, if you have had no fever and you are feeling better, you can end quarantine but need to mask for an additional 5 days. After day 5 if you have a fever or are having significant symptoms, please quarantine for full 10 days.  If you note any worsening of symptoms, any significant shortness of breath or any chest pain, please seek ER evaluation ASAP.  Please do not delay care!  COVID-19: What to Do if You Are Sick If you test positive and are an older adult or someone  who is at high risk of getting very sick from COVID-19, treatment may be available. Contact a healthcare provider right away after a positive test to determine if you are eligible, even if your symptoms are mild right now. You can also visit a Test to Treat location and, if eligible, receive a prescription from a provider. Don't delay: Treatment must be started within the first few days to be effective. If you have a fever, cough, or other symptoms, you might have COVID-19. Most people have mild illness and are able to recover at home. If you are sick: Keep track of your symptoms. If you have an emergency warning sign (including  trouble breathing), call 911. Steps to help prevent the spread of COVID-19 if you are sick If you are sick with COVID-19 or think you might have COVID-19, follow the steps below to care for yourself and to help protect other people in your home and community. Stay home except to get medical care Stay home. Most people with COVID-19 have mild illness and can recover at home without medical care. Do not leave your home, except to get medical care. Do not visit public areas and do not go to places where you are unable to wear a mask. Take care of yourself. Get rest and stay hydrated. Take over-the-counter medicines, such as acetaminophen, to help you feel better. Stay in touch with your doctor. Call before you get medical care. Be sure to get care if you have trouble breathing, or have any other emergency warning signs, or if you think it is an emergency. Avoid public transportation, ride-sharing, or taxis if possible. Get tested If you have symptoms of COVID-19, get tested. While waiting for test results, stay away from others, including staying apart from those living in your household. Get tested as soon as possible after your symptoms start. Treatments may be available for people with COVID-19 who are at risk for becoming very sick. Don't delay: Treatment must be started early to be effective--some treatments must begin within 5 days of your first symptoms. Contact your healthcare provider right away if your test result is positive to determine if you are eligible. Self-tests are one of several options for testing for the virus that causes COVID-19 and may be more convenient than laboratory-based tests and point-of-care tests. Ask your healthcare provider or your local health department if you need help interpreting your test results. You can visit your state, tribal, local, and territorial health department's website to look for the latest local information on testing sites. Separate yourself from  other people As much as possible, stay in a specific room and away from other people and pets in your home. If possible, you should use a separate bathroom. If you need to be around other people or animals in or outside of the home, wear a well-fitting mask. Tell your close contacts that they may have been exposed to COVID-19. An infected person can spread COVID-19 starting 48 hours (or 2 days) before the person has any symptoms or tests positive. By letting your close contacts know they may have been exposed to COVID-19, you are helping to protect everyone. See COVID-19 and Animals if you have questions about pets. If you are diagnosed with COVID-19, someone from the health department may call you. Answer the call to slow the spread. Monitor your symptoms Symptoms of COVID-19 include fever, cough, or other symptoms. Follow care instructions from your healthcare provider and local health department. Your local health authorities may give  instructions on checking your symptoms and reporting information. When to seek emergency medical attention Look for emergency warning signs* for COVID-19. If someone is showing any of these signs, seek emergency medical care immediately: Trouble breathing Persistent pain or pressure in the chest New confusion Inability to wake or stay awake Pale, gray, or blue-colored skin, lips, or nail beds, depending on skin tone *This list is not all possible symptoms. Please call your medical provider for any other symptoms that are severe or concerning to you. Call 911 or call ahead to your local emergency facility: Notify the operator that you are seeking care for someone who has or may have COVID-19. Call ahead before visiting your doctor Call ahead. Many medical visits for routine care are being postponed or done by phone or telemedicine. If you have a medical appointment that cannot be postponed, call your doctor's office, and tell them you have or may have COVID-19.  This will help the office protect themselves and other patients. If you are sick, wear a well-fitting mask You should wear a mask if you must be around other people or animals, including pets (even at home). Wear a mask with the best fit, protection, and comfort for you. You don't need to wear the mask if you are alone. If you can't put on a mask (because of trouble breathing, for example), cover your coughs and sneezes in some other way. Try to stay at least 6 feet away from other people. This will help protect the people around you. Masks should not be placed on young children under age 37 years, anyone who has trouble breathing, or anyone who is not able to remove the mask without help. Cover your coughs and sneezes Cover your mouth and nose with a tissue when you cough or sneeze. Throw away used tissues in a lined trash can. Immediately wash your hands with soap and water for at least 20 seconds. If soap and water are not available, clean your hands with an alcohol-based hand sanitizer that contains at least 60% alcohol. Clean your hands often Wash your hands often with soap and water for at least 20 seconds. This is especially important after blowing your nose, coughing, or sneezing; going to the bathroom; and before eating or preparing food. Use hand sanitizer if soap and water are not available. Use an alcohol-based hand sanitizer with at least 60% alcohol, covering all surfaces of your hands and rubbing them together until they feel dry. Soap and water are the best option, especially if hands are visibly dirty. Avoid touching your eyes, nose, and mouth with unwashed hands. Handwashing Tips Avoid sharing personal household items Do not share dishes, drinking glasses, cups, eating utensils, towels, or bedding with other people in your home. Wash these items thoroughly after using them with soap and water or put in the dishwasher. Clean surfaces in your home regularly Clean and disinfect  high-touch surfaces (for example, doorknobs, tables, handles, light switches, and countertops) in your "sick room" and bathroom. In shared spaces, you should clean and disinfect surfaces and items after each use by the person who is ill. If you are sick and cannot clean, a caregiver or other person should only clean and disinfect the area around you (such as your bedroom and bathroom) on an as needed basis. Your caregiver/other person should wait as long as possible (at least several hours) and wear a mask before entering, cleaning, and disinfecting shared spaces that you use. Clean and disinfect areas that may have  blood, stool, or body fluids on them. Use household cleaners and disinfectants. Clean visible dirty surfaces with household cleaners containing soap or detergent. Then, use a household disinfectant. Use a product from [employer] List N: Disinfectants for Coronavirus (COVID-19). Be sure to follow the instructions on the label to ensure safe and effective use of the product. Many products recommend keeping the surface wet with a disinfectant for a certain period of time (look at "contact time" on the product label). You may also need to wear personal protective equipment, such as gloves, depending on the directions on the product label. Immediately after disinfecting, wash your hands with soap and water for 20 seconds. For completed guidance on cleaning and disinfecting your home, visit Complete Disinfection Guidance. Take steps to improve ventilation at home Improve ventilation (air flow) at home to help prevent from spreading COVID-19 to other people in your household. Clear out COVID-19 virus particles in the air by opening windows, using air filters, and turning on fans in your home. Use this interactive tool to learn how to improve air flow in your home. When you can be around others after being sick with COVID-19 Deciding when you can be around others is different for different situations.  Find out when you can safely end home isolation. For any additional questions about your care, contact your healthcare provider or state or local health department. 11/03/2020 Content source: [facility] for Immunization and Respiratory Diseases ([facility]), Division of Viral Diseases This information is not intended to replace advice given to you by your health care provider. Make sure you discuss any questions you have with your health care provider. Document Revised: 12/17/2020 Document Reviewed: 12/17/2020 Elsevier Patient Education  2022 [employer].      If you have been instructed to have an in-person evaluation today at a local Urgent Care facility, please use the link below. It will take you to a list of all of our available [facility], including address, phone number and hours of operation. Please do not delay care.  [facility]  If you or a family member do not have a primary care provider, use the link below to schedule a visit and establish care. When you choose a [facility] primary care physician or advanced practice provider, you gain a long-term partner in health. Find a Primary Care Provider  Learn more about [facility]'s in-office and virtual care options: [facility] - Get Care Now

## 2022-06-14 NOTE — Progress Notes (Signed)
Virtual Visit Consent   Megan Snow, you are scheduled for a virtual visit with a [facility] provider today. Just as with appointments in the office, your consent must be obtained to participate. Your consent will be active for this visit and any virtual visit you may have with one of our providers in the next 365 days. If you have a MyChart account, a copy of this consent can be sent to you electronically.  As this is a virtual visit, video technology does not allow for your provider to perform a traditional examination. This may limit your provider's ability to fully assess your condition. If your provider identifies any concerns that need to be evaluated in person or the need to arrange testing (such as labs, EKG, etc.), we will make arrangements to do so. Although advances in technology are sophisticated, we cannot ensure that it will always work on either your end or our end. If the connection with a video visit is poor, the visit may have to be switched to a telephone visit. With either a video or telephone visit, we are not always able to ensure that we have a secure connection.  By engaging in this virtual visit, you consent to the provision of healthcare and authorize for your insurance to be billed (if applicable) for the services provided during this visit. Depending on your insurance coverage, you may receive a charge related to this service.  I need to obtain your verbal consent now. Are you willing to proceed with your visit today? [name] has provided verbal consent on 06/14/2022 for a virtual visit (video or telephone). Megan Snow, [place]  Date: 06/14/2022 5:10 PM  Virtual Visit via Video Note   I, Megan Snow, connected with  Megan Snow  ([id number], [birth year]) on 06/14/22 at  5:15 PM EDT by a video-enabled telemedicine application and verified that I am speaking with the correct person using two identifiers.  Location: Patient:  Virtual Visit Location Patient: Home Provider: Virtual Visit Location Provider: Home Office   I discussed the limitations of evaluation and management by telemedicine and the availability of in person appointments. The patient expressed understanding and agreed to proceed.    History of Present Illness: Megan Snow is a 46 y.o. who identifies as a female who was assigned female at birth, and is being seen today for COVID-19. Symptoms starting Monday with mild sore throat, nasal congestion and nasal drainage. Later in the day developed headache, increased congestion and body aches. Husband was exposed via coworker and began with symptoms Sunday, testing positive that day. Patient endorses she took an initial test then which was negative. Last night retested due to symptoms and testing positive. Today with continued symptoms, now with low-grade fever. Denies chest pain, SOB or GI symptoms.   HPI: HPI  Problems:  Patient Active Problem List   Diagnosis Date Noted   Attention deficit hyperactivity disorder, predominantly inattentive type 06/14/2022   History of migraine 06/14/2022   Paresthesia of both lower extremities 10/23/2015   Raynaud disease 10/23/2015   Right tennis elbow 03/01/2012    Allergies: No Known Allergies Medications:  Current Outpatient Medications:    PARAGARD INTRAUTERINE COPPER IU, by Intrauterine route., Disp: , Rfl:    gabapentin (NEURONTIN) 800 MG tablet, Take 800 mg by mouth 3 (three) times daily., Disp: , Rfl:    ibuprofen (ADVIL,MOTRIN) 200 MG tablet, Take 200-800 mg by mouth every 6 (six) hours as needed for mild pain., Disp: ,  Rfl:   Observations/Objective: Patient is well-developed, well-nourished in no acute distress.  Resting comfortably at home.  Head is normocephalic, atraumatic.  No labored breathing. Speech is clear and coherent with logical content.  Patient is alert and oriented at baseline.   Assessment and Plan: 1. COVID-19  Patient  with multiple risk factors for complicated course of illness. Discussed risks/benefits of antiviral medications including most common potential ADRs. Patient voiced understanding and would like to proceed with antiviral medication. They are candidate for molnupiravir. Rx sent to pharmacy. Supportive measures, OTC medications and vitamin regimen reviewed. Tessalon as directed. Flonase per orders. Patient has been enrolled in a MyChart COVID symptom monitoring program. Samule Dry reviewed in detail. Strict ER precautions discussed with patient.    Follow Up Instructions: I discussed the assessment and treatment plan with the patient. The patient was provided an opportunity to ask questions and all were answered. The patient agreed with the plan and demonstrated an understanding of the instructions.  A copy of instructions were sent to the patient via MyChart unless otherwise noted below.   The patient was advised to call back or seek an in-person evaluation if the symptoms worsen or if the condition fails to improve as anticipated.  Time:  I spent 10 minutes with the patient via telehealth technology discussing the above problems/concerns.    Leeanne Rio, PA-C

## 2022-07-19 ENCOUNTER — Other Ambulatory Visit (HOSPITAL_COMMUNITY): Payer: Self-pay | Admitting: Family Medicine

## 2022-07-19 DIAGNOSIS — Z1231 Encounter for screening mammogram for malignant neoplasm of breast: Secondary | ICD-10-CM

## 2022-07-25 ENCOUNTER — Ambulatory Visit (HOSPITAL_COMMUNITY)
Admission: RE | Admit: 2022-07-25 | Discharge: 2022-07-25 | Disposition: A | Discharge status: Home / Self Care | Payer: BC Managed Care – PPO | Source: Ambulatory Visit | Attending: Family Medicine | Admitting: Family Medicine

## 2022-07-25 ENCOUNTER — Encounter (HOSPITAL_COMMUNITY): Payer: Self-pay

## 2022-07-25 DIAGNOSIS — Z1231 Encounter for screening mammogram for malignant neoplasm of breast: Secondary | ICD-10-CM | POA: Diagnosis not present

## 2023-07-31 ENCOUNTER — Other Ambulatory Visit (HOSPITAL_COMMUNITY): Payer: Self-pay | Admitting: Family Medicine

## 2023-07-31 DIAGNOSIS — Z1231 Encounter for screening mammogram for malignant neoplasm of breast: Secondary | ICD-10-CM

## 2023-08-02 ENCOUNTER — Encounter (HOSPITAL_COMMUNITY): Payer: Self-pay

## 2023-08-02 ENCOUNTER — Ambulatory Visit (HOSPITAL_COMMUNITY)
Admission: RE | Admit: 2023-08-02 | Discharge: 2023-08-02 | Disposition: A | Discharge status: Home / Self Care | Payer: BC Managed Care – PPO | Source: Ambulatory Visit | Attending: Family Medicine | Admitting: Family Medicine

## 2023-08-02 DIAGNOSIS — Z1231 Encounter for screening mammogram for malignant neoplasm of breast: Secondary | ICD-10-CM | POA: Insufficient documentation

## 2023-09-25 ENCOUNTER — Ambulatory Visit
Admission: RE | Admit: 2023-09-25 | Discharge: 2023-09-25 | Disposition: A | Discharge status: Home / Self Care | Payer: BC Managed Care – PPO | Source: Ambulatory Visit | Attending: Nurse Practitioner | Admitting: Nurse Practitioner

## 2023-09-25 VITALS — BP 137/82 | HR 88 | Temp 98.4°F | Resp 18

## 2023-09-25 DIAGNOSIS — J101 Influenza due to other identified influenza virus with other respiratory manifestations: Secondary | ICD-10-CM

## 2023-09-25 LAB — POCT INFLUENZA A/B
Influenza A, POC: POSITIVE — AB
Influenza B, POC: NEGATIVE

## 2023-09-25 MED ORDER — OSELTAMIVIR PHOSPHATE 75 MG PO CAPS: 75.0000 mg | ORAL_CAPSULE | Freq: Two times a day (BID) | ORAL | 0 refills | Status: AC

## 2023-09-25 NOTE — ED Triage Notes (Addendum)
 Pt reports she has body aches, headache, throat drainage, cough , and chest discomfort, fever x 2 days. Exposed to flu by daughter

## 2023-09-25 NOTE — Discharge Instructions (Addendum)
 You have been diagnosed with influenza A.  You have been prescribed Tamiflu  1 tablet every 12 hours for 5 days.  You are encouraged to continue with over-the-counter cough suppressant.  You are encouraged to take Tylenol or ibuprofen as directed for body aches and fever.  Please remember not to exceed the normal recommended daily dosing.  You are to hydrate well and get plenty rest.  Follow-up with your primary care provider if symptoms persist or get worse.  If you develop shortness of breath or chest pain you are encouraged to proceed to the nearest emergency room.

## 2023-09-25 NOTE — ED Provider Notes (Signed)
 [facility]    CSN: [id number] Arrival date & time: 09/25/23  1336      History   Chief Complaint Chief Complaint  Patient presents with   Fever    Body aches, chills, cough, raw throat. - Entered by patient    HPI Megan Snow is a 48 y.o. female.   HPI  She is in today for evaluation of flulike symptoms.  She notes that she developed body aches,with a sore throat on Saturday night after being around multiple people.  She endorses that she has been taking DayQuil cold and flu.  She denies fever, shortness of breath or chest pain.  She does have body aches and feels like this is why her chest is tender.  Past Medical History:  Diagnosis Date   Raynaud disease 10/23/2015   Vitamin D deficiency     Patient Active Problem List   Diagnosis Date Noted   Attention deficit hyperactivity disorder, predominantly inattentive type 06/14/2022   History of migraine 06/14/2022   Paresthesia of both lower extremities 10/23/2015   Raynaud disease 10/23/2015   Right tennis elbow 03/01/2012    Past Surgical History:  Procedure Laterality Date   KNEE SURGERY     right    OB History   No obstetric history on file.      Home Medications    Prior to Admission medications   Medication Sig Start Date End Date Taking? Authorizing Provider  oseltamivir  (TAMIFLU ) 75 MG capsule Take 1 capsule (75 mg total) by mouth every 12 (twelve) hours. 09/25/23  Yes Gregoria Leas, NP  fluticasone  (FLONASE ) 50 MCG/ACT nasal spray Place 2 sprays into both nostrils daily. 06/14/22   Farris Hong, PA-C  gabapentin  (NEURONTIN ) 800 MG tablet Take 800 mg by mouth 3 (three) times daily. 06/10/22   [provider]  ibuprofen (ADVIL,MOTRIN) 200 MG tablet Take 200-800 mg by mouth every 6 (six) hours as needed for mild pain.    [provider]  PARAGARD  INTRAUTERINE COPPER  IU by Intrauterine route.    [provider]    Family History Family History   Problem Relation Age of Onset   Hypertension Mother    COPD Mother    Hypertension Father    Neuropathy Father    Breast cancer Maternal Grandmother    Arthritis Other    Cancer Other    Diabetes Other     Social History Social History   Tobacco Use   Smoking status: Never   Smokeless tobacco: Never  Substance Use Topics   Alcohol use: No   Drug use: No     Allergies   Patient has no known allergies.   Review of Systems Review of Systems   Physical Exam Triage Vital Signs ED Triage Vitals  Encounter Vitals Group     BP 09/25/23 1424 137/82     Systolic BP Percentile --      Diastolic BP Percentile --      Pulse Rate 09/25/23 1424 88     Resp 09/25/23 1424 18     Temp 09/25/23 1424 98.4 F (36.9 C)     Temp Source 09/25/23 1424 Oral     SpO2 09/25/23 1424 96 %     Weight --      Height --      Head Circumference --      Peak Flow --      Pain Score 09/25/23 1426 10     Pain Loc --  Pain Education --      Exclude from Growth Chart --    No data found.  Updated Vital Signs BP 137/82 (BP Location: Right Arm)   Pulse 88   Temp 98.4 F (36.9 C) (Oral)   Resp 18   SpO2 96%   Visual Acuity Right Eye Distance:   Left Eye Distance:   Bilateral Distance:    Right Eye Near:   Left Eye Near:    Bilateral Near:     Physical Exam Constitutional:      General: She is not in acute distress.    Appearance: She is normal weight.  HENT:     Head: Normocephalic.     Nose: Nose normal.     Mouth/Throat:     Mouth: Mucous membranes are moist.     Pharynx: No oropharyngeal exudate or posterior oropharyngeal erythema.  Cardiovascular:     Rate and Rhythm: Normal rate and regular rhythm.     Pulses: Normal pulses.     Heart sounds: Normal heart sounds.  Pulmonary:     Effort: Pulmonary effort is normal.     Breath sounds: Normal breath sounds.  Musculoskeletal:        General: Normal range of motion.     Cervical back: Normal range of motion and  neck supple.  Skin:    General: Skin is warm and dry.     Capillary Refill: Capillary refill takes less than 2 seconds.  Neurological:     General: No focal deficit present.     Mental Status: She is alert and oriented to person, place, and time.  Psychiatric:        Mood and Affect: Mood normal.        Behavior: Behavior normal.      UC Treatments / Results  Labs (all labs ordered are listed, but only abnormal results are displayed) Labs Reviewed  POCT INFLUENZA A/B - Abnormal; Notable for the following components:      Result Value   Influenza A, POC Positive (*)    All other components within normal limits    EKG   Radiology No results found.  Procedures Procedures (including critical care time)  Medications Ordered in UC Medications - No data to display  Initial Impression / Assessment and Plan / UC Course  I have reviewed the triage vital signs and the nursing notes.  Pertinent labs & imaging results that were available during my care of the patient were reviewed by me and considered in my medical decision making (see chart for details).     flu Final Clinical Impressions(s) / UC Diagnoses   Final diagnoses:  Influenza A     Discharge Instructions      You have been diagnosed with influenza A.  You have been prescribed Tamiflu  1 tablet every 12 hours for 5 days.  You are encouraged to continue with over-the-counter cough suppressant.  You are encouraged to take Tylenol or ibuprofen as directed for body aches and fever.  Please remember not to exceed the normal recommended daily dosing.  You are to hydrate well and get plenty rest.  Follow-up with your primary care provider if symptoms persist or get worse.  If you develop shortness of breath or chest pain you are encouraged to proceed to the nearest emergency room.     ED Prescriptions     Medication Sig Dispense Auth. Provider   oseltamivir  (TAMIFLU ) 75 MG capsule Take 1 capsule (75 mg total) by  mouth every 12 (twelve) hours. 10 capsule Gregoria Leas, NP      PDMP not reviewed this encounter.   Eleanore Grey [place], [place] 09/25/23 [phone]

## 2024-06-18 ENCOUNTER — Other Ambulatory Visit (HOSPITAL_COMMUNITY): Payer: Self-pay | Admitting: Family Medicine

## 2024-06-18 DIAGNOSIS — Z1231 Encounter for screening mammogram for malignant neoplasm of breast: Secondary | ICD-10-CM

## 2024-07-29 ENCOUNTER — Other Ambulatory Visit: Payer: Self-pay | Admitting: Medical Genetics

## 2024-08-05 ENCOUNTER — Ambulatory Visit (HOSPITAL_COMMUNITY)
Admission: RE | Admit: 2024-08-05 | Discharge: 2024-08-05 | Disposition: A | Discharge status: Home / Self Care | Source: Ambulatory Visit | Attending: Family Medicine | Admitting: Family Medicine

## 2024-08-05 ENCOUNTER — Inpatient Hospital Stay (HOSPITAL_COMMUNITY): Admission: RE | Admit: 2024-08-05 | Discharge: 2024-08-05 | Payer: Self-pay | Attending: Oncology | Admitting: Oncology

## 2024-08-05 DIAGNOSIS — Z1231 Encounter for screening mammogram for malignant neoplasm of breast: Secondary | ICD-10-CM | POA: Insufficient documentation

## 2024-08-20 LAB — GENECONNECT MOLECULAR SCREEN: Genetic Analysis Overall Interpretation: NEGATIVE

## 2024-08-27 ENCOUNTER — Encounter (HOSPITAL_COMMUNITY): Payer: Self-pay | Admitting: Family Medicine

## 2024-08-28 ENCOUNTER — Other Ambulatory Visit (HOSPITAL_COMMUNITY): Payer: Self-pay | Admitting: Family Medicine

## 2024-08-28 DIAGNOSIS — R928 Other abnormal and inconclusive findings on diagnostic imaging of breast: Secondary | ICD-10-CM

## 2024-09-03 ENCOUNTER — Encounter (HOSPITAL_COMMUNITY): Payer: Self-pay

## 2024-09-03 ENCOUNTER — Ambulatory Visit (HOSPITAL_COMMUNITY)
Admission: RE | Admit: 2024-09-03 | Discharge: 2024-09-03 | Disposition: A | Discharge status: Home / Self Care | Source: Ambulatory Visit | Attending: Family Medicine | Admitting: Family Medicine

## 2024-09-03 ENCOUNTER — Ambulatory Visit (HOSPITAL_COMMUNITY)
Admission: RE | Admit: 2024-09-03 | Discharge: 2024-09-03 | Payer: Self-pay | Attending: Family Medicine | Admitting: Family Medicine

## 2024-09-03 DIAGNOSIS — R928 Other abnormal and inconclusive findings on diagnostic imaging of breast: Secondary | ICD-10-CM | POA: Insufficient documentation
